# Patient Record
Sex: Male | Born: 1950 | Race: White | Hispanic: No | Marital: Married | Smoking: Never smoker
Health system: Southern US, Community
[De-identification: ages and names within clinical notes are randomized; demographics above are authoritative.]

## PROBLEM LIST (undated history)

## (undated) DIAGNOSIS — E7800 Pure hypercholesterolemia, unspecified: Secondary | ICD-10-CM

## (undated) DIAGNOSIS — I10 Essential (primary) hypertension: Secondary | ICD-10-CM

## (undated) DIAGNOSIS — G4730 Sleep apnea, unspecified: Secondary | ICD-10-CM

## (undated) DIAGNOSIS — C8300 Small cell B-cell lymphoma, unspecified site: Secondary | ICD-10-CM

## (undated) DIAGNOSIS — M779 Enthesopathy, unspecified: Secondary | ICD-10-CM

## (undated) DIAGNOSIS — E119 Type 2 diabetes mellitus without complications: Secondary | ICD-10-CM

## (undated) HISTORY — DX: Sleep apnea, unspecified: G47.30

## (undated) HISTORY — DX: Enthesopathy, unspecified: M77.9

## (undated) HISTORY — DX: Pure hypercholesterolemia, unspecified: E78.00

## (undated) HISTORY — DX: Essential (primary) hypertension: I10

## (undated) HISTORY — DX: Small cell B-cell lymphoma, unspecified site: C83.00

## (undated) HISTORY — DX: Type 2 diabetes mellitus without complications: E11.9

---
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
## 1999-02-14 ENCOUNTER — Ambulatory Visit (HOSPITAL_COMMUNITY): Admission: RE | Admit: 1999-02-14 | Discharge: 1999-02-14 | Payer: Self-pay | Admitting: Gastroenterology

## 1999-03-07 ENCOUNTER — Emergency Department (HOSPITAL_COMMUNITY): Admission: EM | Admit: 1999-03-07 | Discharge: 1999-03-07 | Payer: Self-pay | Admitting: Emergency Medicine

## 2001-09-20 ENCOUNTER — Encounter: Admission: RE | Admit: 2001-09-20 | Discharge: 2001-09-20 | Payer: Self-pay | Admitting: Orthopedic Surgery

## 2001-09-20 ENCOUNTER — Encounter: Payer: Self-pay | Admitting: Orthopedic Surgery

## 2003-01-23 ENCOUNTER — Ambulatory Visit (HOSPITAL_COMMUNITY): Admission: RE | Admit: 2003-01-23 | Discharge: 2003-01-23 | Payer: Self-pay | Admitting: Gastroenterology

## 2003-01-23 ENCOUNTER — Encounter (INDEPENDENT_AMBULATORY_CARE_PROVIDER_SITE_OTHER): Payer: Self-pay | Admitting: Specialist

## 2004-01-02 ENCOUNTER — Encounter: Admission: RE | Admit: 2004-01-02 | Discharge: 2004-04-01 | Payer: Self-pay | Admitting: Family Medicine

## 2007-03-25 ENCOUNTER — Encounter: Admission: RE | Admit: 2007-03-25 | Discharge: 2007-03-25 | Payer: Self-pay | Admitting: Family Medicine

## 2007-12-19 ENCOUNTER — Ambulatory Visit: Payer: Self-pay | Admitting: Cardiology

## 2007-12-19 ENCOUNTER — Observation Stay (HOSPITAL_COMMUNITY): Admission: EM | Admit: 2007-12-19 | Discharge: 2007-12-20 | Payer: Self-pay | Admitting: Emergency Medicine

## 2007-12-20 ENCOUNTER — Encounter (INDEPENDENT_AMBULATORY_CARE_PROVIDER_SITE_OTHER): Payer: Self-pay | Admitting: Cardiology

## 2008-05-05 IMAGING — CT CT ANGIO CHEST
2 of 4 series · 19 of 36 positions shown · IV contrast (100 ML OMNI 300)
Comparison: Chest radiograph 12/19/2007.

CLINICAL DATA: 56 year-old male who has left-sided chest pain and left shoulder, neck pain with shortness of breath.
 CT ANGIOGRAPHY OF CHEST:
TECHNIQUE: Multidetector CT imaging of the chest was performed during bolus injection of intravenous contrast.  Multiplanar CT angiographic image reconstructions were generated to evaluate the vascular anatomy.
 Contrast:  115ml Omnipaque 300.

[Series 2: pe · axial · 0.82mm/px · z∈[-332,-55]mm · 16 of 249 slices shown]
[im 14/249  lung]
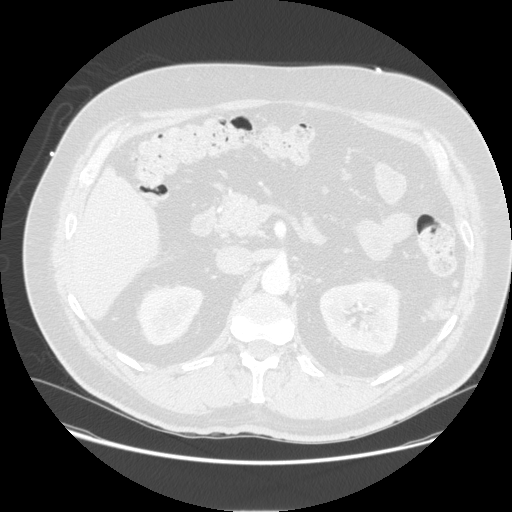
[im 28/249  mediastinal]
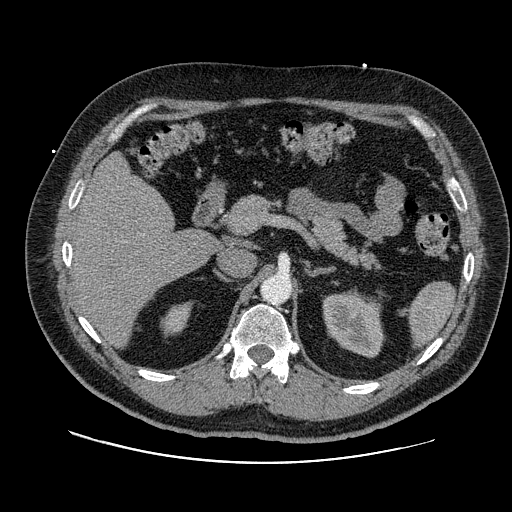
[im 42/249  lung]
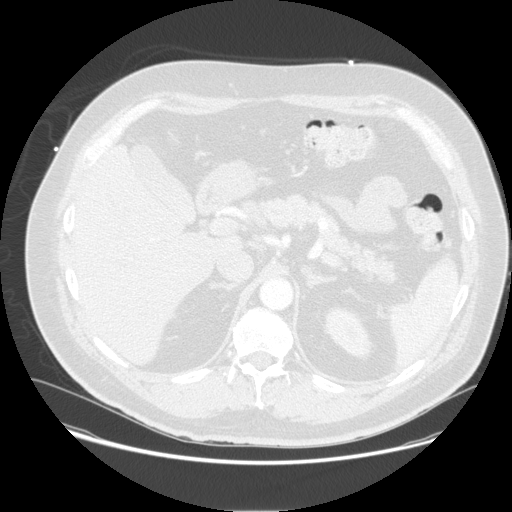
[im 56/249  mediastinal]
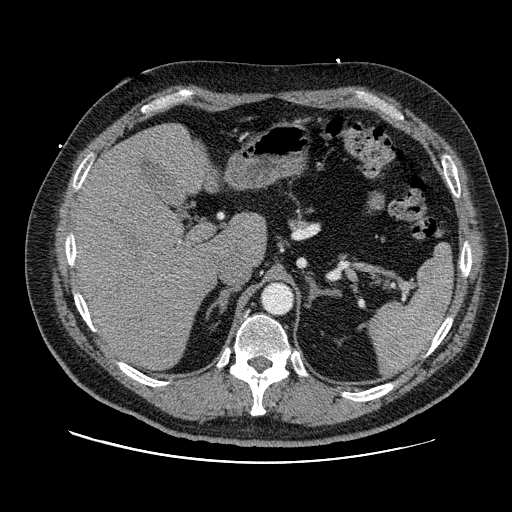
[im 69/249  lung]
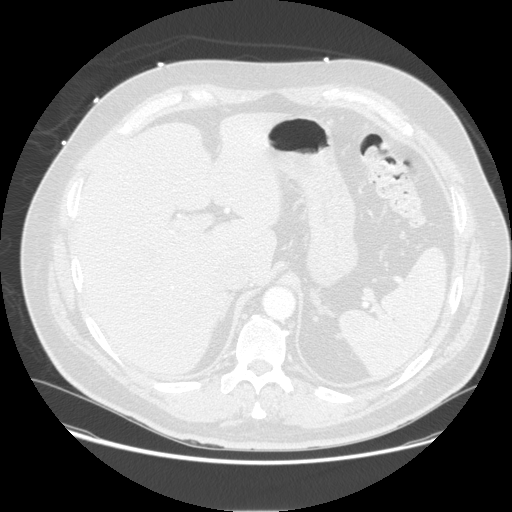
[im 83/249  mediastinal]
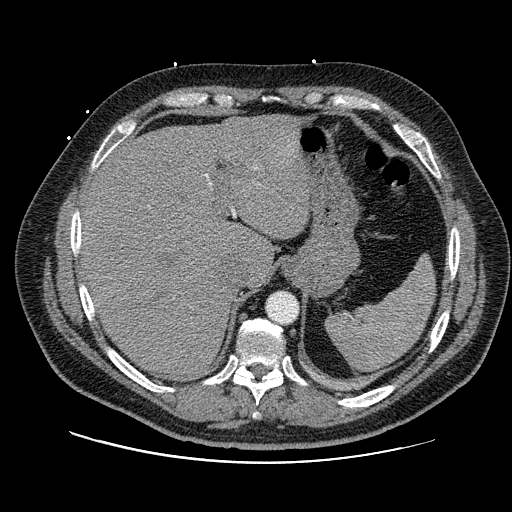
[im 97/249  lung]
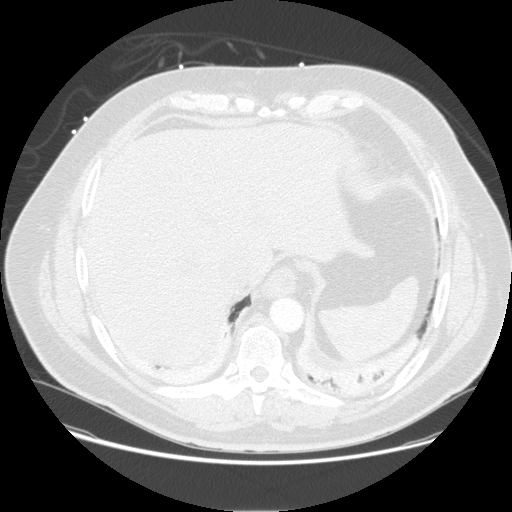
[im 111/249  mediastinal]
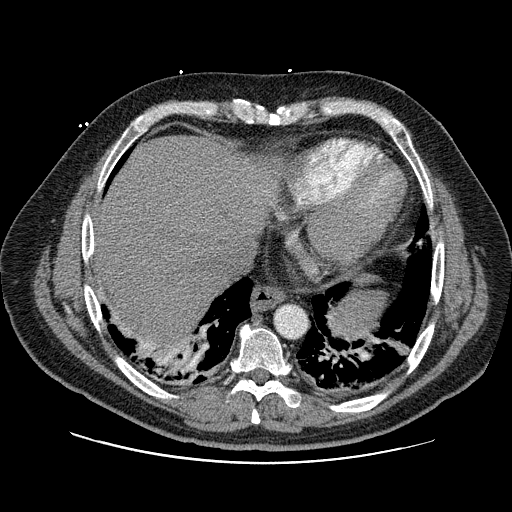
[im 138/249  lung]
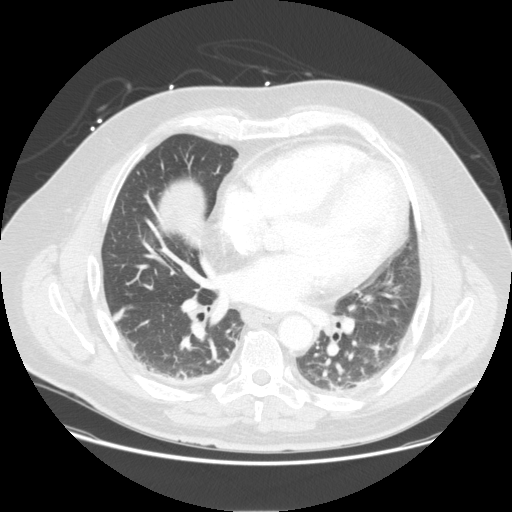
[im 152/249  mediastinal]
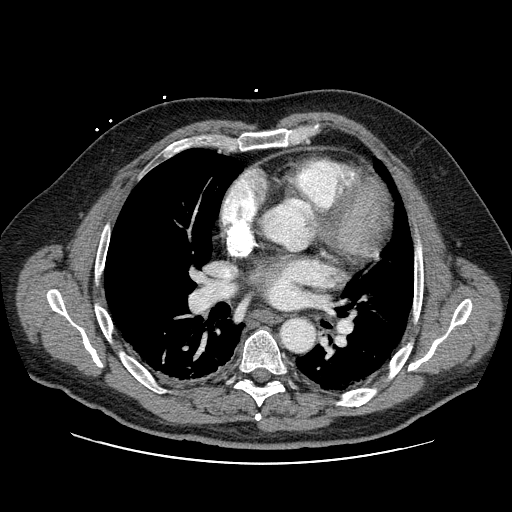
[im 166/249  lung]
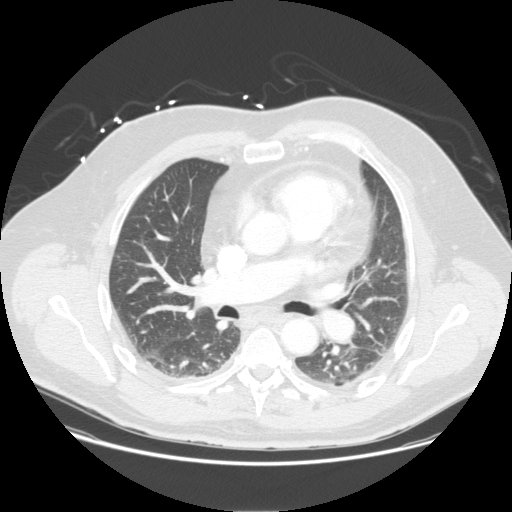
[im 180/249  mediastinal]
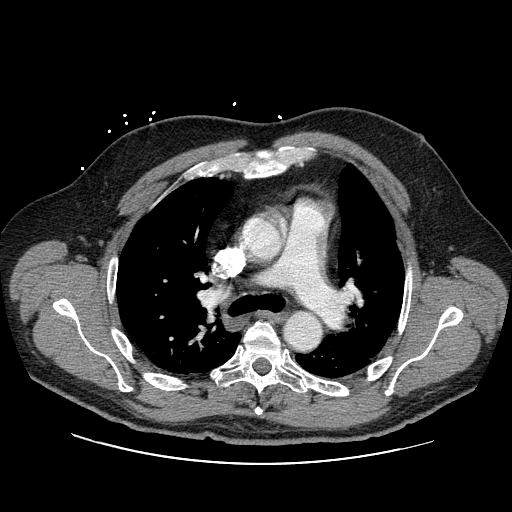
[im 193/249  lung]
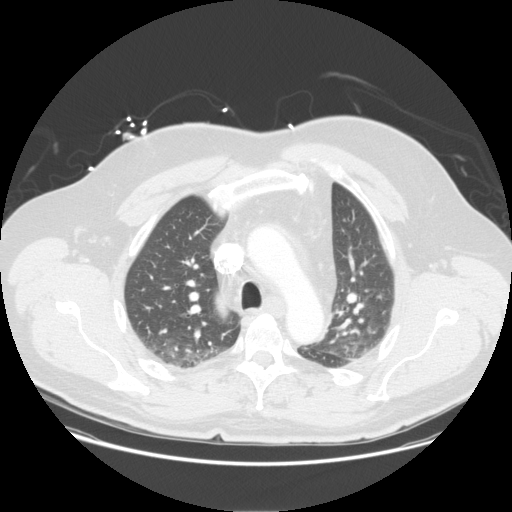
[im 207/249  mediastinal]
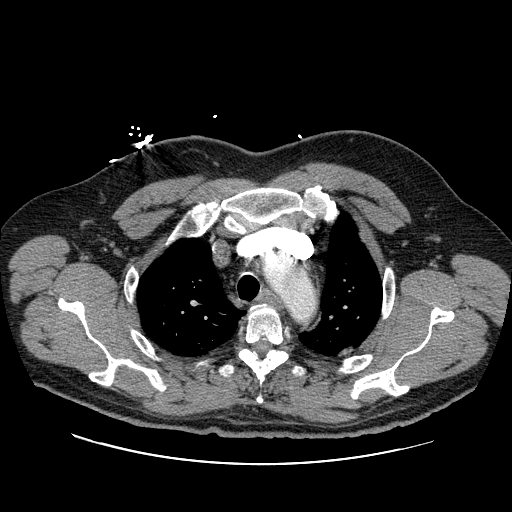
[im 221/249  lung]
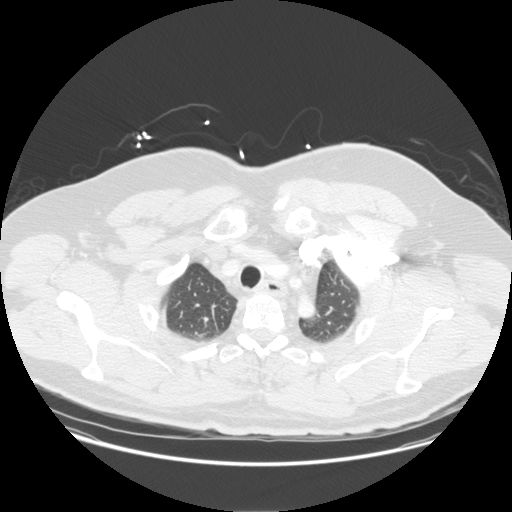
[im 235/249  mediastinal]
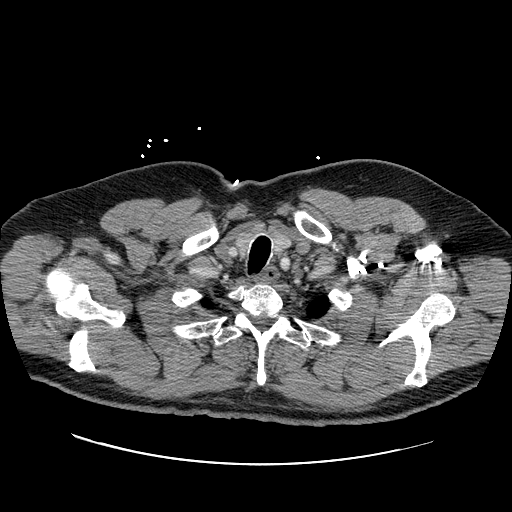

[Series 202: reformatted · coronal · 0.82mm/px · 3 of 100 slices shown]
[im 20/100  mediastinal]
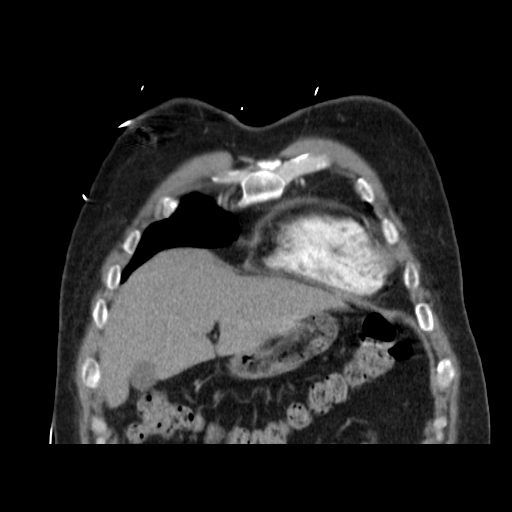
[im 40/100  mediastinal]
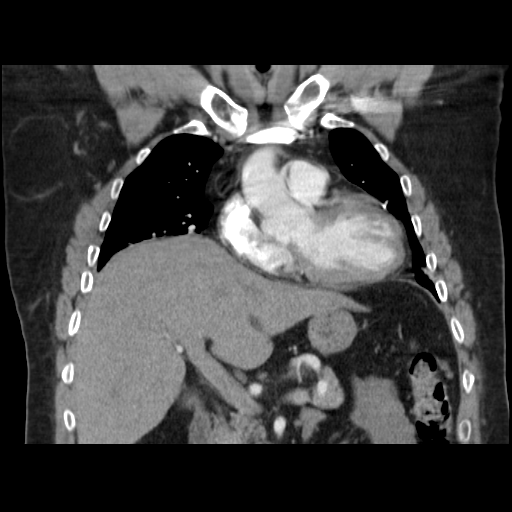
[im 60/100  mediastinal]
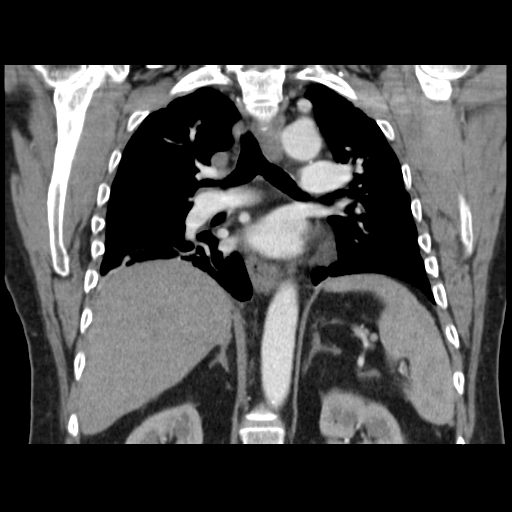

[19 of 36 positions shown; findings below may reference images not displayed]

FINDINGS: Adequate contrast timing in the pulmonary arterial system.  No focal filling defect identified to suggest the presence of pulmonary embolism.
 No pericardial effusion.  Trace right pleural effusion.  No mediastinal lymphadenopathy.  Prominence of the thoracic aorta may indicate mural thickening.  No discrete mass identified.  No axillary lymphadenopathy.  Visualized upper abdominal viscera within normal limits.
 Enhancing atelectatic appearing lung at both lung bases, right greater than left.  No definite consolidation, but there are scant air bronchograms on the right.  Major airways are patent.  Tiny nodular density along the right major fissure.  Lungs are otherwise clear.  Scattered small sclerotic foci in the ribs are thought to represent benign bone islands.  No suspicious osseous lesion.  Multilevel degenerative changes in the thoracic spine.
IMPRESSION: 1. No evidence of pulmonary embolism.
 2. Right greater than left pulmonary probable atelectasis.  I cannot exclude superimposed infection on the right.
 3. Suggestion of diffuse esophageal wall thickening.  Consider esophagitis.  Correlation with endoscopy may be helpful.

## 2011-02-24 NOTE — H&P (Signed)
[name], [name]             ACCOUNT NO.:  [id number]   MEDICAL RECORD NO.:  [id number]          PATIENT TYPE:  INP   LOCATION:  6522                         FACILITY:  [facility]   PHYSICIAN:  Rollene Rotunda, MD, FACCDATE OF BIRTH:  1951/05/06   DATE OF ADMISSION:  12/19/2007  DATE OF DISCHARGE:                              HISTORY & PHYSICAL   PRIMARY CARE PHYSICIAN:  Dr. Abigail Miyamoto   CARDIOLOGIST:  New.   REASON PRESENTATION:  The patient with shoulder discomfort and an  abnormal electrocardiogram.   HISTORY OF PRESENT ILLNESS:  The patient is a 60 year old gentleman  without prior cardiac history.  He developed some acute left shoulder  and neck discomfort at about 5 p.m.  This was at rest.  It was moderate  in intensity.  He was pacing the floor per his wife's description.  He  took 2 aspirin and fell asleep.  At 7, he woke without discomfort;  however, at 9 p.m. the pain returned.  He did take some sublingual  nitroglycerin and had some improvement in his discomfort and almost  complete resolution by the time he presented to the emergency room.  However, in the emergency room he was noted to have inferior ST segment  elevation in leads II, III, and aVF consistent with an acute injury  pattern.  The patient has never had this kind of discomfort before.  There was no substernal chest discomfort.  There was no associated  nausea, vomiting or diaphoresis.  He was not particularly short of  breath.  He is not a particularly active gentleman but does not bring on  the symptoms typically with his usual activities.   PAST MEDICAL HISTORY:  1. Hypertension times years.  2. Diabetes mellitus times 2-3 years.  3. Dyslipidemia.   PAST SURGICAL HISTORY:  Tonsillectomy.   ALLERGIES:  HE IS INTOLERANT OF ACCUPRIL.   CURRENT MEDICATIONS:  1. Norvasc 10 mg daily.  2. Hydrochlorothiazide 25 mg daily.  3. Doxazosin 2 mg daily.  4. Actos 30 mg daily.  5. Lipitor 20 mg daily.   REVIEW OF SYSTEMS:  As stated in the HPI, otherwise negative for other  systems.   PHYSICAL EXAMINATION:  The patient is in no distress.  Blood pressure  122/70.  Rate 81 and regular.  Afebrile.  HEENT:  Eyes unremarkable, pupils equal, round and reactive to light.  Fundi not visualized.  Oral mucosa unremarkable.  NECK:  No jugular venous distention, 45 degrees, carotid upstroke  briskand symmetric.  No bruits, thyromegaly  LYMPHATICS:  No cervical, axillary, inguinal adenopathy.  LUNGS:  Clear to auscultation bilaterally.  BACK:  No costovertebral angle tenderness.  CHEST:  Unremarkable.  HEART:  PMI not displaced, sustained.  S1 and S2, no S3, S4, no clicks,  rubs, murmurs.  ABDOMEN:  Obese, positive bowel sounds.  Normal frequency, pitch.  No  bruits, rebound no guarding no midline pulsatile masshepatomegaly.  SKIN:  No rashes, upper extremities 2+ pulses No edema, no cyanosis, no  clubbing .  NEUROLOGIC:  Oriented to person, place and time.  Cranial nerves II  through XII grossly intact.  Motor grossly intact.   EKG sinus rhythm, rate 77, axis within normal limits.  Intervals within  normal limits.  Inferior ST elevation 2, 3, and aVF with depression in  lead V1.   LABORATORIES:  Pending.   Chest x-ray:  Official report pending, although there appears to be some  mild atelectasis and a poor inspiratory effort.   ASSESSMENT AND PLAN:  1. Chest.  The patient appears to be having an acute inferior      myocardial infarction with EKG changes.  2. Shoulder discomfort.  The ST segments improved with resolution of      his shoulder discomfort.  Because of this, he will be taken      urgently to the cath lab.  He will be given Heparin, aspirin,      Plavix.  Dr. Excell Seltzer will perform the catheterization.  3. Diabetes.  He will continue Actos and sliding scale insulin as      needed.  4. Risk reduction.  We will check a lipid profile.  He is not sure the      dose of Lipitor he is  on, but I will start 20 mg and check a lipid      profile.  5. Obesity.  He will be counseled about the need to lose weight with      diet and exercise.      Rollene Rotunda, MD, [facility]  Electronically Signed     JH/MEDQ  D:  12/19/2007  T:  12/20/2007  Job:  [id number]   cc:   Chales Salmon. Abigail Miyamoto, M.D.

## 2011-02-24 NOTE — Discharge Summary (Signed)
Cory [name], Cory [name]             ACCOUNT NO.:  [id number]   MEDICAL RECORD NO.:  [id number]          PATIENT TYPE:  INP   LOCATION:  6522                         FACILITY:  [facility]   PHYSICIAN:  Georga Hacking, M.D.DATE OF BIRTH:  28-Apr-1951   DATE OF ADMISSION:  12/19/2007  DATE OF DISCHARGE:  12/20/2007                               DISCHARGE SUMMARY   FINAL DIAGNOSES:  1. Chest, shoulder, and neck pain of uncertain etiology with an      abnormal EKG suggesting an inferior infarction.      a.     Catheterization showing no significant obstructive coronary       artery disease.  There was minimal nonobstructive coronary artery       disease noted.  There was normal left ventricular function.       Normal aortic root.      b.     Negative enzymes for myocardial infarction.      c.     CT angiogram showing no evidence of pulmonary emboli or       aortic dissection.      d.     Echocardiogram showing no evidence for pericardial effusion.  2. Hypertension.  3. Type 2 diabetes.  4. Dyslipidemia.  5. Obesity.   HISTORY:  This 60 year old male developed a left shoulder and neck  discomfort around 5 p.m. at rest, described as 5/10.  He took 2 aspirin  and fell asleep and woke at 7 without pain; however, the pain returned,  and he took nitroglycerin that he has used previously and the pain  improved.  He came to the emergency room and had an EKG with 3 mm of ST  elevation in inferolateral leads and ST depression in V1.  Because of  the suggestive nature of the pain, he was taken emergently to the  catheterization laboratory.  Please see the previously dictated history  and physical for remainder of the details.   HOSPITAL COURSE:  Laboratory data on admission showed a white count of  14,500, 73% neutrophils, 18% lymphocytes.  PT and PTT were normal.  Sodium is 139, potassium 3.6, chloride 104, glucose is 172, BUN is 15,  and creatinine is 1.2.  Initial point-of-care enzymes were  normal.  Cardiac enzymes the next morning were completely normal.  Cholesterol  was 121 with triglycerides of 65, HDL of 39, LDL of 69.  White count was  11,700 the next morning with normal hemoglobin.   The patient was taken emergently to the cardiac catheterization  laboratory.  He was not found to have any significant obstructive  coronary disease.  He had normal left ventricular function.  He was  admitted by the cardiology night coverage and I saw him the next  morning.  He had a temperature of 100.3 the next morning, but beyond  that, his exam was unremarkable.  An echocardiogram showed no evidence  of pericardial effusion.  Left ventricular function was normal with  slight disproportionate septal thickening.  He had valves that looked  fine.  A CT angiogram of the chest showed atelectasis in the bases.  No  evidence for pulmonary emboli or acute aortic dissection.  A copy of an  EKG was obtained from Dr. Recardo Evangelist office showing that these were new  changes from a previous EKG.   At this point in time, since he is ruled out from myocardial infarction,  he is discharged at this time in improved condition.  With new EKG  changes, this is possible that he has some early pericarditis, although  he did not have a rub or possibly an infectious etiology causing his  symptoms and possible abnormal EKG such as myocarditis or pericarditis.  At this point in time, we are going to let him go home, and he will be  discharged on aspirin 325 mg twice a day to take for a couple of weeks.  He is to continue his home medications.   DISCHARGE MEDICATIONS:  Include:  1. Amlodipine 10 mg daily.  2. Hydrochlorothiazide 25 mg daily.  3. Doxazosin 2 mg daily.  4. Actos 30 mg daily.  5. Viagra as needed.  6. Aspirin 325 mg twice a day.  7. He has nitroglycerin to use at home.   It is recommended that he follow up with Dr. Abigail Miyamoto for a repeat EKG in  approximately 2 weeks.  He does not need to  have additional cardiology  followup unless he has recurrent symptoms or pain.  He is to monitor his  temperature at home.      Georga Hacking, M.D.  Electronically Signed     WST/MEDQ  D:  12/20/2007  T:  12/21/2007  Job:  [id number]   cc:   Veverly Fells. Excell Seltzer, MD  Chales Salmon. Abigail Miyamoto, M.D.

## 2011-02-24 NOTE — Cardiovascular Report (Signed)
NAME:  Cory [name], Cory [name]             ACCOUNT NO.:  [id number]   MEDICAL RECORD NO.:  [id number]          PATIENT TYPE:  INP   LOCATION:  1823                         FACILITY:  [facility]   PHYSICIAN:  Veverly Fells. Excell Seltzer, MD  DATE OF BIRTH:  1951-03-26   DATE OF PROCEDURE:  12/19/2007  DATE OF DISCHARGE:                            CARDIAC CATHETERIZATION   PROCEDURE:  Left heart catheterization, selective coronary angiography,  left ventricular angiography, aortic root angiography, Angio-Seal of the  right femoral artery.   INDICATIONS:  Cory [name] is a 60 year old gentleman with dyslipidemia,  hypertension and diabetes who presented with chest pain.  His EKG was  suggestive of an inferolateral myocardial infarction.  He was brought  emergently for cardiac catheterization.   Risks and indications of the procedure were reviewed in detail with the  patient and his wife.  Informed consent was obtained.  The right groin  was prepped, draped and anesthetized with 1% lidocaine.  Using modified  Seldinger technique, a 6-French sheath was placed in the right femoral  artery.  Standard 6-French Judkins catheters were used for selective  coronary angiography.  An angled pigtail catheter was used for left  ventriculography and aortic root angiography, a pullback across the  aortic valve was performed.  The patient tolerated procedure well.  All  catheter exchanges were performed over a guidewire.  There were no  immediate complications.  At the completion of the procedure, an Angio-  Seal device was used to close the femoral arteriotomy.   FINDINGS:  Aortic pressure 121/71 with a mean of 93, left ventricular  pressure 121/13.   Coronary angiography.  The left mainstem is angiographically normal.  It  trifurcates into the LAD, intermediate branch and left circumflex.   The LAD is a large-caliber vessel that courses down and wraps around the  LV apex.  There is a large first diagonal branch that  has minimal plaque  in its proximal portion.  Otherwise, there is no significant  angiographic stenosis.  The second diagonal also has minimal plaque in  its ostium, creating a 40% stenosis.  The junction at the mid to distal  LAD has minor nonobstructive plaque and is somewhat phasic suggestive of  an intramyocardial segment.  There are no significant stenoses  throughout the course of the LAD.   The intermediate branch is large-caliber.  There is minor ostial  stenosis but no significant obstructive disease throughout.   The left circumflex is of large caliber.  It is a dominant vessel.  It  courses down and supplies a posterolateral branch as well as a left PDA  and a large atrial branch.  There are no significant stenoses throughout  the left circumflex.   The right coronary artery is nondominant.  It supplies a moderate-sized  RV marginal branch from the proximal portion of the vessel.  There is no  significant stenosis in the right coronary artery.   Left ventriculography shows normal LV function.  The LVEF is estimated  at 60-65%.  There is no mitral regurgitation.   Aortic root angiography shows normal size proximal aorta without  aneurysm.  ASSESSMENT:  1. Minimal nonobstructive coronary artery disease.  2. Normal left ventricular function.  3. Normal aortic root.   PLAN:  Will observe Cory [name] overnight.  We will check a D-dimer with  his a.m. labs.  We will cycle cardiac enzymes, but this initially  appears to be a noncardiac event.      Veverly Fells. Excell Seltzer, MD  Electronically Signed     MDC/MEDQ  D:  12/20/2007  T:  12/20/2007  Job:  [id number]   cc:   Chales Salmon. Abigail Miyamoto, M.D.

## 2011-02-27 NOTE — Op Note (Signed)
   NAME:  Cory [name], Cory [name]                         ACCOUNT NO.:  [id number]   MEDICAL RECORD NO.:  [id number]                   PATIENT TYPE:  AMB   LOCATION:  ENDO                                 FACILITY:  [facility]   PHYSICIAN:  John C. Madilyn Fireman, M.D.                 DATE OF BIRTH:  [birth year]   DATE OF PROCEDURE:  01/23/2003  DATE OF DISCHARGE:                                 OPERATIVE REPORT   INDICATIONS FOR PROCEDURE:  Colon cancer screening.   PROCEDURE:  The patient was placed in the left lateral decubitus position  and placed on the pulse monitor with continuous low flow oxygen delivered by  nasal cannula.  He was sedated with 75 mcg IV Fentanyl and 6 mg IV Versed.  The Olympus video colonoscope was inserted into the rectum and advanced to  the cecum, confirmed by transillumination of McBurney's point and  visualization of the ileocecal valve and appendiceal orifice.  The prep was  excellent.  Within the base of the cecum there was a 6.0 mm polyp which was  fulgurated by hot biopsy.  The remainder of the cecum and ascending colon  appeared normal.  Within the transverse colon there was an 8.0 mm sessile  polyp which was removed by snare and sent in the same specimen container.  Within the descending colon there was seen a third polyp approximately 8.0 -  10 mm in diameter and this was removed by snare.  No other abnormalities  were seen in the descending and sigmoid colon.  In the rectum at  approximately 12 cm from the anus there was an 8.0 mm polyp also removed by  snare and sent in container #2.  The scope was then withdrawn and the  patient returned to the recovery room in stable condition.  He tolerated the  procedure well and there were no immediate complications.   IMPRESSION:  1. Four colon polyps.   PLAN:  Await histology to determine method and intervals for future colon  screening.                                               John C. Madilyn Fireman, M.D.    JCH/MEDQ  D:   01/23/2003  T:  01/23/2003  Job:  [id number]   cc:   Chales Salmon. Abigail Miyamoto, M.D.  [address]  [place]  [place] [address]  Fax: [phone]

## 2011-04-17 ENCOUNTER — Other Ambulatory Visit: Payer: Self-pay | Admitting: Gastroenterology

## 2011-07-06 LAB — I-STAT 8, (EC8 V) (CONVERTED LAB)
Acid-Base Excess: 2
BUN: 15
Bicarbonate: 26.9 — ABNORMAL HIGH
Chloride: 104
pCO2, Ven: 44 — ABNORMAL LOW
pH, Ven: 7.395 — ABNORMAL HIGH

## 2011-07-06 LAB — LIPID PANEL
Cholesterol: 121
LDL Cholesterol: 69

## 2011-07-06 LAB — DIFFERENTIAL
Eosinophils Absolute: 0
Lymphs Abs: 2.6
Monocytes Absolute: 1.2 — ABNORMAL HIGH
Monocytes Relative: 8
Neutrophils Relative %: 73

## 2011-07-06 LAB — CBC
HCT: 42.6
Hemoglobin: 14.7
MCHC: 34.5
MCV: 84.8
Platelets: 258
RBC: 5.02
WBC: 11.1 — ABNORMAL HIGH
WBC: 14.5 — ABNORMAL HIGH

## 2011-07-06 LAB — CARDIAC PANEL(CRET KIN+CKTOT+MB+TROPI)
CK, MB: 0.6
CK, MB: 0.7
Relative Index: INVALID
Total CK: 56
Troponin I: <0.01

## 2011-07-06 LAB — POCT CARDIAC MARKERS
CKMB, poc: <1 — ABNORMAL LOW
Myoglobin, poc: 66
Troponin i, poc: <0.05

## 2011-07-06 LAB — SEDIMENTATION RATE: Sed Rate: 12

## 2011-07-06 LAB — D-DIMER, QUANTITATIVE: D-Dimer, Quant: <0.22

## 2011-07-06 LAB — POCT I-STAT CREATININE: Creatinine, Ser: 1.2

## 2011-07-06 LAB — PROTIME-INR: INR: 0.9

## 2012-06-03 ENCOUNTER — Telehealth: Payer: Self-pay | Admitting: Oncology

## 2012-06-03 NOTE — Telephone Encounter (Signed)
C/D on 8/23 for NP appt on 9/10

## 2012-06-03 NOTE — Telephone Encounter (Signed)
S/W pt re NP appt on 9/10 @ 10:30 W/ Dr. Clelia Croft.  Referring Dr. Tenny Craw [facility]  NP packet mailed out.

## 2012-06-15 ENCOUNTER — Other Ambulatory Visit: Payer: Self-pay | Admitting: Dermatology

## 2012-06-17 ENCOUNTER — Other Ambulatory Visit: Payer: Self-pay | Admitting: Oncology

## 2012-06-17 DIAGNOSIS — D72829 Elevated white blood cell count, unspecified: Secondary | ICD-10-CM | POA: Insufficient documentation

## 2012-06-21 ENCOUNTER — Other Ambulatory Visit (HOSPITAL_COMMUNITY)
Admission: RE | Admit: 2012-06-21 | Discharge: 2012-06-21 | Disposition: A | Discharge status: Home / Self Care | Payer: 59 | Source: Ambulatory Visit | Attending: Oncology | Admitting: Oncology

## 2012-06-21 ENCOUNTER — Ambulatory Visit (HOSPITAL_BASED_OUTPATIENT_CLINIC_OR_DEPARTMENT_OTHER): Payer: 59 | Admitting: Oncology

## 2012-06-21 ENCOUNTER — Other Ambulatory Visit (HOSPITAL_BASED_OUTPATIENT_CLINIC_OR_DEPARTMENT_OTHER): Payer: 59 | Admitting: Lab

## 2012-06-21 ENCOUNTER — Telehealth: Payer: Self-pay | Admitting: Oncology

## 2012-06-21 ENCOUNTER — Ambulatory Visit: Payer: Self-pay

## 2012-06-21 VITALS — BP 142/76 | HR 60 | Temp 98.9°F | Resp 22 | Ht 71.5 in | Wt 261.9 lb

## 2012-06-21 DIAGNOSIS — I10 Essential (primary) hypertension: Secondary | ICD-10-CM

## 2012-06-21 DIAGNOSIS — G4733 Obstructive sleep apnea (adult) (pediatric): Secondary | ICD-10-CM | POA: Insufficient documentation

## 2012-06-21 DIAGNOSIS — D72829 Elevated white blood cell count, unspecified: Secondary | ICD-10-CM

## 2012-06-21 DIAGNOSIS — D72820 Lymphocytosis (symptomatic): Secondary | ICD-10-CM | POA: Insufficient documentation

## 2012-06-21 DIAGNOSIS — R5381 Other malaise: Secondary | ICD-10-CM

## 2012-06-21 LAB — CBC WITH DIFFERENTIAL/PLATELET
Eosinophils Absolute: 0.1 10*3/uL (ref 0.0–0.5)
MONO#: 1 10*3/uL — ABNORMAL HIGH (ref 0.1–0.9)
MONO%: 7.3 % (ref 0.0–14.0)
NEUT#: 5.3 10*3/uL (ref 1.5–6.5)
RBC: 5.05 10*6/uL (ref 4.20–5.82)
RDW: 13.2 % (ref 11.0–14.6)
WBC: 13.6 10*3/uL — ABNORMAL HIGH (ref 4.0–10.3)
lymph#: 7.1 10*3/uL — ABNORMAL HIGH (ref 0.9–3.3)

## 2012-06-21 LAB — COMPREHENSIVE METABOLIC PANEL (CC13)
Albumin: 4.1 g/dL (ref 3.5–5.0)
Alkaline Phosphatase: 54 U/L (ref 40–150)
Glucose: 82 mg/dl (ref 70–99)
Potassium: 4.1 mEq/L (ref 3.5–5.1)
Sodium: 141 mEq/L (ref 136–145)
Total Protein: 6.7 g/dL (ref 6.4–8.3)

## 2012-06-21 LAB — TECHNOLOGIST REVIEW

## 2012-06-21 NOTE — Telephone Encounter (Signed)
appts made and printed for pt  °

## 2012-06-21 NOTE — Progress Notes (Signed)
Note dictated

## 2012-06-21 NOTE — Progress Notes (Signed)
CC:   Cory [name]) Cory [name], M.D.  REASON FOR CONSULTATION:  Leukocytosis.  HISTORY OF PRESENT ILLNESS:  [name] is a pleasant 61 year old gentleman, native of the [place] part of [place], currently of [place] for the last 20 years.  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  He is a gentleman with a past medical history of obesity, hypertension, and diabetes followed by Dr. Tenny [name] at [facility].  He has, as mentioned, complaints of fatigue and tiredness and was diagnosis with low testosterone, and was treated with testosterone supplements without really any benefit.  But, he was noted recently to have leukocytosis based on a recent CBC om May 30, 2012.  His white cell count was 14.3.  Previously in February 2013, it was 12.6, before that was 7.6 in June 2011.  The rest of his CBC was normal with a normal hemoglobin, normal hematocrit, normal platelet count, normal kidney function and liver function tests.  The patient was referred to me for evaluation for that issue.  Clinically, as mentioned, he does report some fatigue but is still able to perform most of his work-related duties without any hindrance or decline.  He does report some tiredness on the weekend but able to drive.  He had not reported any fevers, did not report any chills, had not reported any weight loss, appetite changes.  He did not report any constitutional symptoms.  He did not have any viral illnesses.  Did not report any arthralgias and myalgias.  The rest of his review of systems, he did not report any headaches, blurred vision, double vision.  He did not report any motor or sensory neuropathy.  He did not report any alteration in mental status, psychiatric issues, depression.  He did not report any fever, chills, sweats.  He did not report any cough, hemoptysis, hematemesis.  No nausea or vomiting.  No abdominal pain, hematochezia, melena, genitourinary  complaints.  Rest of review of systems is unremarkable.  PAST MEDICAL HISTORY:  Obesity, hypertension, diabetes, hyperlipidemia, history of skin cancer, sleep apnea.  MEDICATION:  Aspirin, multivitamin, Viagra as needed, losartan, metformin, amlodipine, atorvastatin, glyburide.  ALLERGIES:  Accupril.  SOCIAL HISTORY:  He is married.  He has 1 daughter.  Denied any alcohol or tobacco abuse.  Works in Airline pilot.  FAMILY HISTORY:  He has 1 sister who had breast cancer.  Father died in his 24s of coronary artery disease.  Mother is 31 and in good health.  PHYSICAL EXAMINATION:  General:  Alert, awake gentleman, appeared in no active distress today.  Vital Signs:  His blood pressure is 142/76, pulse is 60, respirations 22, temperature is 98.  ECOG performance status is 0.  HEENT:  Head is normocephalic, atraumatic.  Pupils equal, round, reactive to light.  Oral mucosa moist and pink.  Neck:  Supple without adenopathy.  Heart:  Regular rate and rhythm.  S1, S2.  Lungs: Clear to auscultation.  Abdomen:  Soft, nontender.  No hepatosplenomegaly.  Extremities:  No clubbing, cyanosis, or edema. Lymphatic:  I could not appreciate any lymphadenopathy.  LABORATORY DATA:  White cell count of 13.6, hemoglobin of 14, platelet count of 289.  His lymphocyte percentage is up at 52%, slightly low neutrophil percentage at 38.8.  Peripheral smear was personally reviewed today.  Did not really show any evidence of myelodysplasia.  Cannot appreciate any blasts.  There is no evidence of any schistocytosis or red cell fragmentation.  There are  slightly increased lymphocytes that did not appear abnormal at this time.  ASSESSMENT AND PLAN:  This is a 61 year old gentleman with a mild leukocytosis and mild lymphocytosis.  The differential diagnosis discussed with Cory [name] which includes a viral infection versus reactive lymphocytosis, versus medication related, versus a lymphoproliferative disorder.  The  most common condition for his age group would be chronic lymphocytic leukemia or chronic lymphocytic leukemia (CLL).  I am evaluating that as well.  I am obtaining a peripheral blood for flow cytometry.  I discussed the natural course of CLL as potentially the worse case scenario in him.  The potential complications associated with CLL, short-term and long-term, were discussed.  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  I do not think his fatigue or tiredness related to this.  Probably related mostly to his obesity, sleep apnea, and diabetes more than anything else.  All of his questions were answered today and I will have him follow up in 3 month's time. Recheck his blood and have another conversation.  If it is indeed CLL, I think watch and wait would be the way to manage this at this point as no treatment is warranted at this time.    ______________________________ Benjiman Core, M.D. FNS/MEDQ  D:  06/21/2012  T:  06/21/2012  Job:  [id number]

## 2012-09-20 ENCOUNTER — Ambulatory Visit (HOSPITAL_BASED_OUTPATIENT_CLINIC_OR_DEPARTMENT_OTHER): Payer: 59 | Admitting: Oncology

## 2012-09-20 ENCOUNTER — Other Ambulatory Visit (HOSPITAL_BASED_OUTPATIENT_CLINIC_OR_DEPARTMENT_OTHER): Payer: 59 | Admitting: Lab

## 2012-09-20 ENCOUNTER — Telehealth: Payer: Self-pay | Admitting: Oncology

## 2012-09-20 VITALS — BP 142/78 | HR 69 | Temp 97.1°F | Resp 20 | Ht 71.5 in | Wt 262.8 lb

## 2012-09-20 DIAGNOSIS — D72820 Lymphocytosis (symptomatic): Secondary | ICD-10-CM

## 2012-09-20 DIAGNOSIS — D72829 Elevated white blood cell count, unspecified: Secondary | ICD-10-CM

## 2012-09-20 LAB — COMPREHENSIVE METABOLIC PANEL (CC13)
Albumin: 4.2 g/dL (ref 3.5–5.0)
BUN: 14 mg/dL (ref 7.0–26.0)
CO2: 29 mEq/L (ref 22–29)
Calcium: 9.9 mg/dL (ref 8.4–10.4)
Glucose: 120 mg/dl — ABNORMAL HIGH (ref 70–99)
Potassium: 3.9 mEq/L (ref 3.5–5.1)
Sodium: 141 mEq/L (ref 136–145)
Total Protein: 6.8 g/dL (ref 6.4–8.3)

## 2012-09-20 LAB — TECHNOLOGIST REVIEW

## 2012-09-20 LAB — CBC WITH DIFFERENTIAL/PLATELET
Basophils Absolute: 0.2 10*3/uL — ABNORMAL HIGH (ref 0.0–0.1)
Eosinophils Absolute: 0.1 10*3/uL (ref 0.0–0.5)
HGB: 14.8 g/dL (ref 13.0–17.1)
MONO#: 0.5 10*3/uL (ref 0.1–0.9)
NEUT#: 4.3 10*3/uL (ref 1.5–6.5)
Platelets: 319 10*3/uL (ref 140–400)
RBC: 5.12 10*6/uL (ref 4.20–5.82)
RDW: 13.3 % (ref 11.0–14.6)
WBC: 14.4 10*3/uL — ABNORMAL HIGH (ref 4.0–10.3)

## 2012-09-20 LAB — CHCC SMEAR

## 2012-09-20 NOTE — Progress Notes (Signed)
Hematology and Oncology Follow Up Visit  Cory [name] [id number] 1951-01-08 61 y.o. 09/20/2012 9:33 AM   Principle Diagnosis: 61 year old with lymphocytosis. 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 Diagnosed in 06/2102.   Current therapy: Follow up only  Interim History: 61 year old man I am seeing in follow up since his first visit in 06/2012. He has lymphocytosis and flow cytometry show possible CLL markers. He continued to be asymptomatic at this time He does report some tiredness on the weekend but able to drive. He had not reported any fevers, did not report any chills, had not reported any weight loss, appetite changes. He did not report any constitutional symptoms. He did not have any viral illnesses. Did not report any arthralgias and myalgias.   Medications: I have reviewed the patient's current medications. Current outpatient prescriptions:amLODipine (NORVASC) 10 MG tablet, Take 10 mg by mouth Daily., Disp: , Rfl: ;  aspirin 81 MG tablet, Take 81 mg by mouth daily., Disp: , Rfl: ;  atorvastatin (LIPITOR) 40 MG tablet, Take 40 mg by mouth Daily., Disp: , Rfl: ;  glimepiride (AMARYL) 2 MG tablet, Take 2 mg by mouth Daily., Disp: , Rfl: ;  losartan-hydrochlorothiazide (HYZAAR) 100-25 MG per tablet, Take by mouth Daily., Disp: , Rfl:  metFORMIN (GLUCOPHAGE-XR) 500 MG 24 hr tablet, Take 500 mg by mouth., Disp: , Rfl: ;  Multiple Vitamin (MULTIVITAMIN) capsule, Take 1 capsule by mouth daily., Disp: , Rfl: ;  Multiple Vitamins-Minerals (ICAPS MV PO), Take 1 capsule by mouth daily., Disp: , Rfl: ;  sildenafil (VIAGRA) 50 MG tablet, Take 50 mg by mouth daily as needed., Disp: , Rfl:   Allergies:  Allergies  Allergen Reactions  . Accupril (Quinapril Hcl)     Past Medical History, Surgical history, Social history, and Family History were reviewed and updated.  Review of Systems: Constitutional:  Negative for fever, chills, night sweats, anorexia, weight loss,  pain. Cardiovascular: no chest pain or dyspnea on exertion Respiratory: no cough, shortness of breath, or wheezing Neurological: negative Dermatological: negative ENT: negative Skin: Negative. Gastrointestinal: negative Genito-Urinary: negative Hematological and Lymphatic: negative Breast: negative Musculoskeletal: negative Remaining ROS negative. Physical Exam: Blood pressure 142/78, pulse 69, temperature 97.1 F (36.2 C), temperature source Oral, resp. rate 20, height 5' 11.5" (1.816 m), weight 262 lb 12.8 oz (119.205 kg). ECOG: 0 General appearance: alert Head: Normocephalic, without obvious abnormality, atraumatic Neck: no adenopathy, no carotid bruit, no JVD, supple, symmetrical, trachea midline and thyroid not enlarged, symmetric, no tenderness/mass/nodules Lymph nodes: Cervical, supraclavicular, and axillary nodes normal. Heart:regular rate and rhythm, S1, S2 normal, no murmur, click, rub or gallop Lung:chest clear, no wheezing, rales, normal symmetric air entry Abdomin: soft, non-tender, without masses or organomegaly EXT:no erythema, induration, or nodules   Lab Results: Lab Results  Component Value Date   WBC 14.4* 09/20/2012   HGB 14.8 09/20/2012   HCT 44.3 09/20/2012   MCV 86.6 09/20/2012   PLT 319 09/20/2012     Chemistry      Component Value Date/Time   NA 141 06/21/2012 1038   NA 138 12/19/2007 2253   K 4.1 06/21/2012 1038   K 3.6 12/19/2007 2253   CL 104 06/21/2012 1038   CL 104 12/19/2007 2253   CO2 28 06/21/2012 1038   BUN 14.0 06/21/2012 1038   BUN 15 12/19/2007 2253   CREATININE 0.9 06/21/2012 1038   CREATININE 1.2 12/19/2007 2253      Component Value Date/Time  CALCIUM 10.1 06/21/2012 1038   ALKPHOS 54 06/21/2012 1038   AST 18 06/21/2012 1038   ALT 21 06/21/2012 1038   BILITOT 0.70 06/21/2012 1038      Impression and Plan: This is a 61 year old gentleman with a mild leukocytosis and mild lymphocytosis. Flow cytometry did show B cell markers consistent  with CLL.  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  At this point he is asymptomatic.  I will continue active follow up and repeat exam and blood work in 6 months.     [facility], MD 12/10/20139:33 AM

## 2012-09-20 NOTE — Telephone Encounter (Signed)
gv and printed appt schedule for pt for June 2014 ° °

## 2012-10-12 HISTORY — PX: KNEE SURGERY: SHX244

## 2012-11-26 ENCOUNTER — Other Ambulatory Visit: Payer: Self-pay

## 2012-12-20 DIAGNOSIS — E1051 Type 1 diabetes mellitus with diabetic peripheral angiopathy without gangrene: Secondary | ICD-10-CM | POA: Insufficient documentation

## 2012-12-20 DIAGNOSIS — G4730 Sleep apnea, unspecified: Secondary | ICD-10-CM | POA: Insufficient documentation

## 2012-12-20 DIAGNOSIS — G20A1 Parkinson's disease without dyskinesia, without mention of fluctuations: Secondary | ICD-10-CM | POA: Insufficient documentation

## 2012-12-20 DIAGNOSIS — E785 Hyperlipidemia, unspecified: Secondary | ICD-10-CM | POA: Insufficient documentation

## 2012-12-20 DIAGNOSIS — G20 Parkinson's disease: Secondary | ICD-10-CM | POA: Insufficient documentation

## 2012-12-20 DIAGNOSIS — E1059 Type 1 diabetes mellitus with other circulatory complications: Secondary | ICD-10-CM | POA: Insufficient documentation

## 2012-12-20 DIAGNOSIS — I10 Essential (primary) hypertension: Secondary | ICD-10-CM | POA: Insufficient documentation

## 2013-01-02 ENCOUNTER — Telehealth: Payer: Self-pay

## 2013-01-02 NOTE — Telephone Encounter (Signed)
Patient states should he say on Ropinirole ? Patient sates he is still having shuffling gait.

## 2013-01-02 NOTE — Telephone Encounter (Signed)
I have called him, normal MRI brain was normal.  He is on titrating dose of requip, tolerating it well, continues to take it.

## 2013-03-02 ENCOUNTER — Encounter: Payer: Self-pay | Admitting: Neurology

## 2013-03-02 DIAGNOSIS — G20 Parkinson's disease: Secondary | ICD-10-CM

## 2013-03-02 DIAGNOSIS — E785 Hyperlipidemia, unspecified: Secondary | ICD-10-CM

## 2013-03-02 DIAGNOSIS — E1059 Type 1 diabetes mellitus with other circulatory complications: Secondary | ICD-10-CM

## 2013-03-02 DIAGNOSIS — I10 Essential (primary) hypertension: Secondary | ICD-10-CM

## 2013-03-02 DIAGNOSIS — G4730 Sleep apnea, unspecified: Secondary | ICD-10-CM

## 2013-03-03 ENCOUNTER — Ambulatory Visit: Payer: Self-pay | Admitting: Neurology

## 2013-03-21 ENCOUNTER — Ambulatory Visit (HOSPITAL_BASED_OUTPATIENT_CLINIC_OR_DEPARTMENT_OTHER): Payer: 59 | Admitting: Oncology

## 2013-03-21 ENCOUNTER — Other Ambulatory Visit (HOSPITAL_BASED_OUTPATIENT_CLINIC_OR_DEPARTMENT_OTHER): Payer: 59 | Admitting: Lab

## 2013-03-21 ENCOUNTER — Telehealth: Payer: Self-pay | Admitting: Oncology

## 2013-03-21 VITALS — BP 132/72 | HR 65 | Temp 97.5°F | Resp 18 | Ht 71.0 in | Wt 263.5 lb

## 2013-03-21 DIAGNOSIS — D72829 Elevated white blood cell count, unspecified: Secondary | ICD-10-CM

## 2013-03-21 LAB — CBC WITH DIFFERENTIAL/PLATELET
BASO%: 1.3 % (ref 0.0–2.0)
EOS%: 0.7 % (ref 0.0–7.0)
MCH: 29.2 pg (ref 27.2–33.4)
MCHC: 34.5 g/dL (ref 32.0–36.0)
NEUT%: 31.1 % — ABNORMAL LOW (ref 39.0–75.0)
RBC: 5.05 10*6/uL (ref 4.20–5.82)
RDW: 13.7 % (ref 11.0–14.6)
lymph#: 11.7 10*3/uL — ABNORMAL HIGH (ref 0.9–3.3)

## 2013-03-21 LAB — COMPREHENSIVE METABOLIC PANEL (CC13)
ALT: 23 U/L (ref 0–55)
AST: 15 U/L (ref 5–34)
Calcium: 9.4 mg/dL (ref 8.4–10.4)
Chloride: 104 mEq/L (ref 98–107)
Creatinine: 1 mg/dL (ref 0.7–1.3)
Potassium: 3.7 mEq/L (ref 3.5–5.1)
Sodium: 141 mEq/L (ref 136–145)

## 2013-03-21 NOTE — Telephone Encounter (Signed)
Gave pt appt for lab and MD for December 2014 °

## 2013-03-21 NOTE — Progress Notes (Signed)
Hematology and Oncology Follow Up Visit  Cory [name] [id number] Nov 29, 1950 62 y.o. 03/21/2013 8:51 AM   Principle Diagnosis: 62 year old with lymphocytosis. 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   Current therapy: Observation and surveillance.   Interim History: 62 year old man I am seeing in follow up. He has lymphocytosis and flow cytometry show possible CLL markers. He continued to be asymptomatic at this time He does report some tiredness on the weekend but able to drive. He had not reported any fevers, did not report any chills, had not reported any weight loss, appetite changes. He did not report any constitutional symptoms. He did not have any viral illnesses. Did not report any arthralgias and myalgias. He was diagnosed with Parkinson's disease but still very functional.    Medications: I have reviewed the patient's current medications.  Current Outpatient Prescriptions  Medication Sig Dispense Refill  . amLODipine (NORVASC) 10 MG tablet Take 10 mg by mouth Daily.      Marland Kitchen aspirin 81 MG tablet Take 81 mg by mouth daily.      Marland Kitchen atorvastatin (LIPITOR) 40 MG tablet Take 40 mg by mouth Daily.      Marland Kitchen glimepiride (AMARYL) 2 MG tablet Take 2 mg by mouth Daily.      Marland Kitchen losartan-hydrochlorothiazide (HYZAAR) 100-25 MG per tablet Take by mouth Daily.      . metFORMIN (GLUCOPHAGE-XR) 500 MG 24 hr tablet Take 500 mg by mouth.      . Multiple Vitamin (MULTIVITAMIN) capsule Take 1 capsule by mouth daily.      . Multiple Vitamins-Minerals (ICAPS MV PO) Take 1 capsule by mouth daily.      Marland Kitchen rOPINIRole (REQUIP) 2 MG tablet Take 2 mg by mouth at bedtime. Three (3) tablets at bedtime      . sildenafil (VIAGRA) 50 MG tablet Take 50 mg by mouth daily as needed.       No current facility-administered medications for this visit.    Allergies:  Allergies  Allergen Reactions  . Accupril (Quinapril Hcl)     Past Medical History, Surgical history, Social history, and Family History were reviewed and  updated.  Review of Systems: Remaining ROS negative.  Physical Exam: Blood pressure 132/72, pulse 65, temperature 97.5 F (36.4 C), temperature source Oral, resp. rate 18, height 5\' 11"  (1.803 m), weight 263 lb 8 oz (119.523 kg). ECOG: 0 General appearance: alert Head: Normocephalic, without obvious abnormality, atraumatic Neck: no adenopathy, no carotid bruit, no JVD, supple, symmetrical, trachea midline and thyroid not enlarged, symmetric, no tenderness/mass/nodules Lymph nodes: Cervical, supraclavicular, and axillary nodes normal. Heart:regular rate and rhythm, S1, S2 normal, no murmur, click, rub or gallop Lung:chest clear, no wheezing, rales, normal symmetric air entry Abdomin: soft, non-tender, without masses or organomegaly EXT:no erythema, induration, or nodules   Lab Results: Lab Results  Component Value Date   WBC 18.6* 03/21/2013   HGB 14.7 03/21/2013   HCT 42.8 03/21/2013   MCV 84.7 03/21/2013   PLT 319 03/21/2013     Chemistry      Component Value Date/Time   NA 141 09/20/2012 0859   NA 138 12/19/2007 2253   K 3.9 09/20/2012 0859   K 3.6 12/19/2007 2253   CL 103 09/20/2012 0859   CL 104 12/19/2007 2253   CO2 29 09/20/2012 0859   BUN 14.0 09/20/2012 0859   BUN 15 12/19/2007 2253   CREATININE 0.9 09/20/2012 0859   CREATININE 1.2 12/19/2007 2253      Component  Value Date/Time   CALCIUM 9.9 09/20/2012 0859   ALKPHOS 63 09/20/2012 0859   AST 19 09/20/2012 0859   ALT 30 09/20/2012 0859   BILITOT 0.66 09/20/2012 0859      Impression and Plan: This is a 62 year old gentleman with a mild leukocytosis and mild lymphocytosis. Flow cytometry did show B cell markers consistent with CLL.  At this point he continues to be asymptomatic. He has nothing to indicate treatment at this time.  I educated him about symptoms associated with CLL including adenopathy, autoimmune cytopenias and infections. I will continue active follow up and repeat exam and blood work in 6 months.      [facility], MD 6/10/20148:51 AM

## 2013-03-22 ENCOUNTER — Ambulatory Visit: Payer: Self-pay | Admitting: Neurology

## 2013-03-22 ENCOUNTER — Encounter: Payer: Self-pay | Admitting: Neurology

## 2013-03-22 ENCOUNTER — Ambulatory Visit (INDEPENDENT_AMBULATORY_CARE_PROVIDER_SITE_OTHER): Payer: 59 | Admitting: Neurology

## 2013-03-22 VITALS — BP 138/71 | HR 64 | Ht 71.0 in | Wt 262.0 lb

## 2013-03-22 DIAGNOSIS — G20 Parkinson's disease: Secondary | ICD-10-CM

## 2013-03-22 MED ORDER — ROPINIROLE HCL ER 6 MG PO TB24: 18.0000 mg | ORAL_TABLET | Freq: Every day | ORAL | Status: DC

## 2013-03-22 MED ORDER — RASAGILINE MESYLATE 1 MG PO TABS: 1.0000 mg | ORAL_TABLET | Freq: Every day | ORAL | Status: DC

## 2013-03-22 NOTE — Progress Notes (Signed)
History of Present Illness:   Cory [name] is a 62 years old right-handed Caucasian male, accompanied by his wife, referred by his primary care physician patterns loss for evaluation of possible Parkinson's disease.  He had past medical history of obesity, diabetes, hypertension, hyperlipidemia, obstructive sleep apnea, using CPAP machine, sedentary lifestyle  2 years ago, he developed intermittent mild erectile dysfunction, also noticed loss sense of smell, was considered due to CPAP machine use, wife also noticed he has excessive movement during his sleep, vivid dreams, acting and talking during dreams, he has occasionally lightheadedness when getting up from seated position, next  One year ago, he was noticed to have decreased stride while walking, decreased arm swing, he has fell couple times over the past couple years, but no clear reasons, there was no significant hands tremor  His maternal grandmother suffered Parkinson's disease, he works as a Musician a lot of driving,desk job, sedentary lifestyle  MRI brain (without contrast) demonstrating subtle left frontal subcortical focus (3mm) of gliosis.  He tolerated requip xr 2mg  3 tabs qhs well, no significant side effect, but benefit only lasted few days, no significant improvement noticed now, he has mild gait difficulty, fatigue easily,    Physical Exam  Neck: supple no carotid bruits Respiratory: clear to auscultation bilaterally Cardiovascular: regular rate rhythm  Neurologic Exam  Mental Status: obesity, decreased facial expression awake, alert, cooperative to history, talking, and casual conversation. Cranial Nerves: CN II-XII pupils were equal round reactive to light.  Fundi were sharp bilaterally.  Extraocular movements were full.  Visual fields were full on confrontational test.  Facial sensation and strength were normal.  Hearing was intact to finger rubbing bilaterally.  Uvula tongue were midline.  Head turning and  shoulder shrugging were normal and symmetric.  Tongue protrusion into the cheeks strength were normal.  Motor:  there was no significant resting tremor, mild right more than left limb and nuchal rigidity,mild early fatigabilitywith rapid wrist opening closure, finger tapping, Sensory: Normal to light touch, pinprick, proprioception, and vibratory sensation. Coordination: Normal finger-to-nose, heel-to-shin.  There was no dysmetria noticed. Gait and Station: Narrow based and steady, decreased bilateral arm swing,decreased stride, mild retropulse instability. Romberg sign: Negative Reflexes: Deep tendon reflexes: Biceps: 2/2, Brachioradialis: 2/2, Triceps: 2/2, Pateller: 2/2, Achilles: 1/1.  Plantar responses are flexor.  Assessment and Plan: 62 years old Caucasian male, with multiple vascular risk factors, including diabetes, hypertension hyperlipidemia, obesity, sedentary lifestyle, obstructive sleep apnea, presenting with 2 years history of loss of sense of smell, one-year history of mild gait difficulty, small shuffling gait, with decreased arm swing  1. Most consistent with idiopathic Parkinson's disease 2. titrating  ropinirole ER  6 mg  3 tablets every night 3. Add on Azilect 1mg  qday, moderate daily exercise 4. Return to clinic in 3 months

## 2013-03-26 ENCOUNTER — Other Ambulatory Visit: Payer: Self-pay

## 2013-03-26 MED ORDER — ROPINIROLE HCL ER 6 MG PO TB24: 18.0000 mg | ORAL_TABLET | Freq: Every day | ORAL | Status: DC

## 2013-03-26 MED ORDER — RASAGILINE MESYLATE 1 MG PO TABS: 1.0000 mg | ORAL_TABLET | Freq: Every day | ORAL | Status: DC

## 2013-07-17 ENCOUNTER — Encounter: Payer: Self-pay | Admitting: Neurology

## 2013-07-17 ENCOUNTER — Ambulatory Visit (INDEPENDENT_AMBULATORY_CARE_PROVIDER_SITE_OTHER): Payer: 59 | Admitting: Neurology

## 2013-07-17 DIAGNOSIS — G20 Parkinson's disease: Secondary | ICD-10-CM

## 2013-07-17 DIAGNOSIS — E785 Hyperlipidemia, unspecified: Secondary | ICD-10-CM

## 2013-07-17 NOTE — Progress Notes (Signed)
History of Present Illness:   Mr. Stillings is a 62 years old right-handed Caucasian male, accompanied by his wife, referred by his primary care physician patterns loss for evaluation of possible Parkinson's disease.  He had past medical history of obesity, diabetes, hypertension, hyperlipidemia, obstructive sleep apnea, using CPAP machine, sedentary lifestyle  2 years ago around 2012, he developed intermittent mild erectile dysfunction, also noticed loss sense of smell, was considered due to CPAP machine use, wife also noticed that he has excessive movement during his sleep, vivid dreams, acting and talking during dreams, he has occasionally lightheadedness when getting up from seated position   One year ago, he was noticed to have decreased stride while walking, decreased arm swing, he has fell couple times over the past couple years, but no clear reasons, there was no significant hands tremor  His maternal grandmother suffered Parkinson's disease, he works as a Musician a lot of driving,desk job, sedentary lifestyle  MRI brain (without contrast) demonstrating subtle left frontal subcortical focus (3mm) of gliosis.  He tolerated requip xr 2mg  3 tabs qhs well, no significant side effect, but benefit only lasted few days, no significant improvement noticed now, he has mild gait difficulty, fatigue easily  UPDATE Oct 6th 2014.  He is taking Requip XR 6mg  3 tabs qhs, no side effect. He is also taking Azilect 1mg  qday, he does not exercise, he has shuffling gait, small stride.  She is recovering from her colon cancer.     Physical Exam  Neck: supple no carotid bruits Respiratory: clear to auscultation bilaterally Cardiovascular: regular rate rhythm  Neurologic Exam  Mental Status: obesity, decreased facial expression awake, alert, cooperative to history, talking, and casual conversation. Cranial Nerves: CN II-XII pupils were equal round reactive to light.  Fundi were sharp  bilaterally.  Extraocular movements were full.  Visual fields were full on confrontational test.  Facial sensation and strength were normal.  Hearing was intact to finger rubbing bilaterally.  Uvula tongue were midline.  Head turning and shoulder shrugging were normal and symmetric.  Tongue protrusion into the cheeks strength were normal.  Motor:  there was no significant resting tremor, mild right more than left limb and nuchal rigidity,mild early fatigabilitywith rapid wrist opening closure, finger tapping, Sensory: Normal to light touch, pinprick, proprioception, and vibratory sensation. Coordination: Normal finger-to-nose, heel-to-shin.  There was no dysmetria noticed. Gait and Station: Narrow based and steady, decreased bilateral arm swing,decreased stride, mild retropulse instability. Romberg sign: Negative Reflexes: Deep tendon reflexes: Biceps: 2/2, Brachioradialis: 2/2, Triceps: 2/2, Pateller: 2/2, Achilles: 1/1.  Plantar responses are flexor.  Assessment and Plan: 62 years old Caucasian male, with multiple vascular risk factors, including diabetes, hypertension hyperlipidemia, obesity, sedentary lifestyle, obstructive sleep apnea, presenting with 2 years history of loss of sense of smell, one-year history of mild gait difficulty, small shuffling gait, with decreased arm swing  1. Most consistent with idiopathic Parkinson's disease 2. titrating  ropinirole ER  6 mg  3 tablets every night 3.  Azilect 1mg  qday, moderate daily exercise 4. Return to clinic in 6 month with Eber Jones

## 2013-08-17 ENCOUNTER — Other Ambulatory Visit: Payer: Self-pay

## 2013-09-20 ENCOUNTER — Other Ambulatory Visit (HOSPITAL_BASED_OUTPATIENT_CLINIC_OR_DEPARTMENT_OTHER): Payer: 59

## 2013-09-20 ENCOUNTER — Ambulatory Visit (HOSPITAL_BASED_OUTPATIENT_CLINIC_OR_DEPARTMENT_OTHER): Payer: 59 | Admitting: Oncology

## 2013-09-20 VITALS — BP 141/66 | HR 62 | Temp 97.0°F | Resp 18 | Ht 71.0 in | Wt 270.0 lb

## 2013-09-20 DIAGNOSIS — D72829 Elevated white blood cell count, unspecified: Secondary | ICD-10-CM

## 2013-09-20 DIAGNOSIS — G20 Parkinson's disease: Secondary | ICD-10-CM

## 2013-09-20 DIAGNOSIS — D72820 Lymphocytosis (symptomatic): Secondary | ICD-10-CM

## 2013-09-20 LAB — COMPREHENSIVE METABOLIC PANEL (CC13)
Alkaline Phosphatase: 59 U/L (ref 40–150)
BUN: 13.1 mg/dL (ref 7.0–26.0)
Glucose: 246 mg/dl — ABNORMAL HIGH (ref 70–140)
Sodium: 139 mEq/L (ref 136–145)
Total Bilirubin: 0.59 mg/dL (ref 0.20–1.20)

## 2013-09-20 LAB — MANUAL DIFFERENTIAL
ALC: 15.8 10*3/uL — ABNORMAL HIGH (ref 0.9–3.3)
ANC (CHCC manual diff): 5.2 10*3/uL (ref 1.5–6.5)
Band Neutrophils: 0 % (ref 0–10)
Blasts: 0 % (ref 0–0)
LYMPH: 73 % — ABNORMAL HIGH (ref 14–49)
SEG: 22 % — ABNORMAL LOW (ref 38–77)
Variant Lymph: 0 % (ref 0–0)
nRBC: 0 % (ref 0–0)

## 2013-09-20 LAB — CBC WITH DIFFERENTIAL/PLATELET
HCT: 42.3 % (ref 38.4–49.9)
MCH: 29 pg (ref 27.2–33.4)
MCHC: 33.1 g/dL (ref 32.0–36.0)
Platelets: 314 10*3/uL (ref 140–400)

## 2013-09-20 NOTE — Addendum Note (Signed)
Addended by: Reesa Chew on: 09/20/2013 08:55 AM   Modules accepted: Medications

## 2013-09-20 NOTE — Progress Notes (Signed)
Hematology and Oncology Follow Up Visit  Cory [name] [id number] [birth year] 62 y.o. 09/20/2013 8:34 AM   Principle Diagnosis: 62 year old with lymphocytosis likely due to early stage CLL diagnosed in 2013.   Current therapy: Observation and surveillance.   Interim History: 62 year old man I am seeing in follow up visit with his wife. He has lymphocytosis and flow cytometry show possible CLL markers. 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 He had not reported any fevers, did not report any chills, had not reported any weight loss, appetite changes. He did not report any constitutional symptoms. He did not have any viral illnesses. Did not report any arthralgias and myalgias. He was diagnosed with Parkinson's disease but still very functional and follows up with neurology regarding that.   Medications: I have reviewed the patient's current medications.  Current Outpatient Prescriptions  Medication Sig Dispense Refill  . amLODipine (NORVASC) 10 MG tablet Take 10 mg by mouth Daily.      Marland Kitchen aspirin 81 MG tablet Take 81 mg by mouth daily.      Marland Kitchen atorvastatin (LIPITOR) 40 MG tablet Take 40 mg by mouth Daily.      Marland Kitchen glimepiride (AMARYL) 2 MG tablet Take 2 mg by mouth Daily.      Marland Kitchen losartan-hydrochlorothiazide (HYZAAR) 100-25 MG per tablet Take by mouth Daily.      . metFORMIN (GLUCOPHAGE-XR) 500 MG 24 hr tablet Take 500 mg by mouth 2 (two) times daily.       . Multiple Vitamin (MULTIVITAMIN) capsule Take 1 capsule by mouth daily.      . Multiple Vitamins-Minerals (ICAPS MV PO) Take 1 capsule by mouth daily.      . rasagiline (AZILECT) 1 MG TABS Take 1 tablet (1 mg total) by mouth daily.  90 tablet  3  . Ropinirole HCl 6 MG TB24 Take 3 tablets (18 mg total) by mouth at bedtime.  270 tablet  3  . sildenafil (VIAGRA) 50 MG tablet Take 50 mg by mouth daily as needed.       No current facility-administered  medications for this visit.    Allergies:  Allergies  Allergen Reactions  . Accupril [Quinapril Hcl]     Past Medical History, Surgical history, Social history, and Family History were reviewed and updated.  Review of Systems: Remaining ROS negative.  Physical Exam: Blood pressure 141/66, pulse 62, temperature 97 F (36.1 C), temperature source Oral, resp. rate 18, height 5\' 11"  (1.803 m), weight 270 lb (122.471 kg), SpO2 98.00%. ECOG: 0 General appearance: alert Head: Normocephalic, without obvious abnormality, atraumatic Neck: no adenopathy, no carotid bruit, no JVD, supple, symmetrical, trachea midline and thyroid not enlarged, symmetric, no tenderness/mass/nodules Lymph nodes: Cervical, supraclavicular, and axillary nodes normal. Heart:regular rate and rhythm, S1, S2 normal, no murmur, click, rub or gallop Lung:chest clear, no wheezing, rales, normal symmetric air entry Abdomin: soft, non-tender, without masses or organomegaly EXT:no erythema, induration, or nodules Neurological exam: No deficits noted grossly.  Lab Results: Lab Results  Component Value Date   WBC 21.6* 09/20/2013   HGB 14.0 09/20/2013   HCT 42.3 09/20/2013   MCV 87.4 09/20/2013   PLT 314 09/20/2013     Chemistry      Component Value Date/Time   NA 141 03/21/2013 0818   NA 138 12/19/2007 2253   K 3.7 03/21/2013 0818   K 3.6 12/19/2007 2253  CL 104 03/21/2013 0818   CL 104 12/19/2007 2253   CO2 26 03/21/2013 0818   BUN 14.3 03/21/2013 0818   BUN 15 12/19/2007 2253   CREATININE 1.0 03/21/2013 0818   CREATININE 1.2 12/19/2007 2253      Component Value Date/Time   CALCIUM 9.4 03/21/2013 0818   ALKPHOS 54 03/21/2013 0818   AST 15 03/21/2013 0818   ALT 23 03/21/2013 0818   BILITOT 0.64 03/21/2013 0818      Impression and Plan: This is a 62 year old gentleman with: 1. Leukocytosis and mild lymphocytosis. Flow cytometry did show B cell markers consistent with CLL. At this point he continues to be asymptomatic.  He has nothing to indicate treatment at this time. I educated him about symptoms associated with CLL including adenopathy, autoimmune cytopenias and infections. I will continue active follow up and repeat exam and blood work in 6 months.   2. Parkinson's: He follows with neurology and continue to be functional at this time.    [facility], MD 12/10/20148:34 AM

## 2013-10-02 ENCOUNTER — Other Ambulatory Visit: Payer: Self-pay | Admitting: Dermatology

## 2014-01-19 ENCOUNTER — Ambulatory Visit (INDEPENDENT_AMBULATORY_CARE_PROVIDER_SITE_OTHER): Payer: 59 | Admitting: Nurse Practitioner

## 2014-01-19 ENCOUNTER — Encounter: Payer: Self-pay | Admitting: Nurse Practitioner

## 2014-01-19 VITALS — BP 146/79 | HR 62 | Ht 70.25 in | Wt 249.0 lb

## 2014-01-19 DIAGNOSIS — E785 Hyperlipidemia, unspecified: Secondary | ICD-10-CM

## 2014-01-19 DIAGNOSIS — G20 Parkinson's disease: Secondary | ICD-10-CM

## 2014-01-19 DIAGNOSIS — G4730 Sleep apnea, unspecified: Secondary | ICD-10-CM

## 2014-01-19 DIAGNOSIS — E1059 Type 1 diabetes mellitus with other circulatory complications: Secondary | ICD-10-CM

## 2014-01-19 MED ORDER — RASAGILINE MESYLATE 1 MG PO TABS: 1.0000 mg | ORAL_TABLET | Freq: Every day | ORAL | Status: DC

## 2014-01-19 MED ORDER — ROPINIROLE HCL ER 6 MG PO TB24: 18.0000 mg | ORAL_TABLET | Freq: Every day | ORAL | Status: DC

## 2014-01-19 NOTE — Progress Notes (Signed)
[facility]  PATIENT: Cory [name] DOB: [birth year]   REASON FOR VISIT: follow up for Parkinsons disease    HISTORY OF PRESENT ILLNESS: Mr. Sahni, 63 year old male returns for followup. He has a history of idiopathic Parkinson's disease. He claims he lost his sense of smell about 2 years ago. He has never had significant tremor. He does notice vivid dreams and excessive movement during sleep. He has obstructive sleep apnea and uses CPAP but his settings have never been changed. He is a Hotel manager. He gets no regular exercise and has a sedentary lifestyle. He is not fallen recently. He is currently on ropinirole 18 mg daily ER and Azilect was added at his last visit. He is doing well on this combination. He returns for reevaluation  HISTORY:evaluation of possible Parkinson's disease.  He had past medical history of obesity, diabetes, hypertension, hyperlipidemia, obstructive sleep apnea, using CPAP machine, sedentary lifestyle  2 years ago around 2012, he developed intermittent mild erectile dysfunction, also noticed loss sense of smell, was considered due to CPAP machine use, wife also noticed that he has excessive movement during his sleep, vivid dreams, acting and talking during dreams, he has occasionally lightheadedness when getting up from seated position  One year ago, he was noticed to have decreased stride while walking, decreased arm swing, he has fell couple times over the past couple years, but no clear reasons, there was no significant hands tremor  His maternal grandmother suffered Parkinson's disease, he works as a Personal assistant a lot of driving,desk job, sedentary lifestyle  MRI brain (without contrast) demonstrating subtle left frontal subcortical focus (31mm) of gliosis.  He tolerated requip xr 2mg  3 tabs qhs well, no significant side effect, but benefit only lasted few days, no significant improvement noticed now, he has mild gait difficulty, fatigue  easily  UPDATE Oct 6th 2014.  He is taking Requip XR 6mg  3 tabs qhs, no side effect. He is also taking Azilect 1mg  qday, he does not exercise, he has shuffling gait, small stride. He is recovering from colon cancer.   REVIEW OF SYSTEMS: Full 14 system review of systems performed and notable only for those listed, all others are neg:  Constitutional: N/A  Cardiovascular: Leg swelling Ear/Nose/Throat: N/A  Skin: N/A  Eyes: N/A  Respiratory: N/A  Gastroitestinal: N/A  Hematology/Lymphatic: N/A  Endocrine: Excessive thirst  Musculoskeletal:N/A  Allergy/Immunology: N/A  Neurological: N/A Psychiatric: N/A Sleep obstructive sleep apnea   ALLERGIES: Allergies  Allergen Reactions  . Accupril [Quinapril Hcl]     HOME MEDICATIONS: Outpatient Prescriptions Prior to Visit  Medication Sig Dispense Refill  . aspirin 81 MG tablet Take 81 mg by mouth daily.      Marland Kitchen atorvastatin (LIPITOR) 40 MG tablet Take 40 mg by mouth Daily.      Marland Kitchen glimepiride (AMARYL) 2 MG tablet Take 2 mg by mouth Daily.      Marland Kitchen losartan-hydrochlorothiazide (HYZAAR) 100-25 MG per tablet Take by mouth Daily.      . metFORMIN (GLUCOPHAGE-XR) 500 MG 24 hr tablet Take 500 mg by mouth 2 (two) times daily.       . Multiple Vitamin (MULTIVITAMIN) capsule Take 1 capsule by mouth daily.      . Multiple Vitamins-Minerals (ICAPS MV PO) Take 1 capsule by mouth daily.      . rasagiline (AZILECT) 1 MG TABS Take 1 tablet (1 mg total) by mouth daily.  90 tablet  3  . Ropinirole HCl 6 MG TB24 Take 3 tablets (18 mg  total) by mouth at bedtime.  270 tablet  3  . sildenafil (VIAGRA) 50 MG tablet Take 50 mg by mouth daily as needed.      Marland Kitchen amLODipine (NORVASC) 10 MG tablet Take 10 mg by mouth Daily.       No facility-administered medications prior to visit.    PAST MEDICAL HISTORY: Past Medical History  Diagnosis Date  . Hypertension   . Diabetes   . High cholesterol   . Sleep apnea   . Lymphoplasmacytoid lymphoma, CLL     Pre     PAST SURGICAL HISTORY: Past Surgical History  Procedure Laterality Date  . Knee surgery Left 2014  . Tonsillectomy  1952    FAMILY HISTORY: Family History  Problem Relation Age of Onset  . Stroke Father   . Heart attack Father   . Sleep apnea Mother   . Cancer Sister     Breast cancer  . Diabetes Sister   . Sleep apnea Brother     SOCIAL HISTORY: History   Social History  . Marital Status: Married    Spouse Name: N/A    Number of Children: 1  . Years of Education: BS   Occupational History  .      Works at [employer] Topics  . Smoking status: Never Smoker   . Smokeless tobacco: Former Systems developer    Types: Snuff    Quit date: 03/22/1989  . Alcohol Use: Yes     Comment: Consumes alcohol on occasion  . Drug Use: No  . Sexual Activity: Not on file   Other Topics Concern  . Not on file   Social History Narrative   Pt is here today for his 4 mo check up.Pt is married and does still work at this time.   Married, 1 children   Left handed   BS degree   Drinks caffeine free           PHYSICAL EXAM  Filed Vitals:   01/19/14 0836  BP: 146/79  Pulse: 62  Height: 5' 10.25" (1.784 m)  Weight: 249 lb (112.946 kg)   Body mass index is 35.49 kg/(m^2).  Generalized: Well developed, obese male in no acute distress  Head: normocephalic and atraumatic,. Oropharynx benign  Neck: Supple, no carotid bruits  Cardiac: Regular rate rhythm, no murmur  Musculoskeletal: No deformity   Neurological examination   Mentation: Alert oriented to time, place, history taking. Follows all commands speech and language fluent  Cranial nerve II-XII: Pupils were equal round reactive to light extraocular movements were full, visual field were full on confrontational test. Facial sensation and strength were normal. hearing was intact to finger rubbing bilaterally. Uvula tongue midline. head turning and shoulder shrug were normal and symmetric.Tongue protrusion into  cheek strength was normal. Motor: normal bulk and tone, full strength in the BUE, BLE, fine finger movements normal, no pronator drift. No focal weakness, no resting tremor, no bradykinesia Coordination: finger-nose-finger, heel-to-shin bilaterally, no dysmetria Reflexes: Brachioradialis 2/2, biceps 2/2, triceps 2/2, patellar 2/2, Achilles 1/1, plantar responses were flexor bilaterally. Gait and Station: Rising up from seated position without assistance, normal stance,  moderate stride, decreased arm swing bilaterally, ambulating 210 feet in 55 seconds no difficulty with turns  DIAGNOSTIC DATA (LABS, IMAGING, TESTING) - I reviewed patient records, labs, notes, testing and imaging myself where available.  Lab Results  Component Value Date   WBC 21.6* 09/20/2013   HGB 14.0 09/20/2013   HCT 42.3 09/20/2013  MCV 87.4 09/20/2013   PLT 314 09/20/2013      Component Value Date/Time   NA 139 09/20/2013 0803   NA 138 12/19/2007 2253   K 3.5 09/20/2013 0803   K 3.6 12/19/2007 2253   CL 104 03/21/2013 0818   CL 104 12/19/2007 2253   CO2 26 09/20/2013 0803   GLUCOSE 246* 09/20/2013 0803   GLUCOSE 205* 03/21/2013 0818   GLUCOSE 172* 12/19/2007 2253   BUN 13.1 09/20/2013 0803   BUN 15 12/19/2007 2253   CREATININE 1.0 09/20/2013 0803   CREATININE 1.2 12/19/2007 2253   CALCIUM 9.7 09/20/2013 0803   PROT 6.6 09/20/2013 0803   ALBUMIN 4.0 09/20/2013 0803   AST 18 09/20/2013 0803   ALT 24 09/20/2013 0803   ALKPHOS 59 09/20/2013 0803   BILITOT 0.59 09/20/2013 0803     ASSESSMENT AND PLAN  63 y.o. year old male  has a past medical history of Hypertension; Diabetes; High cholesterol; Sleep apnea; and Lymphoplasmacytoid lymphoma, CLL. and gait abnormality most consistent with idiopathic Parkinson's disease here for followup. He is a patient of Dr. Krista Blue who is out of the office today.  Continue ropinirole at current dose will refill Continue Azilect  at current dose, will refill Given patient information  on rapid eye movement sleep disorder behavior Try exercise by walking at least daily, this is better than medications for Parkinson's disease Followup in 6 months Dennie Bible, [facility], [facility] [place], [facility] [address], [place], [place] [address] [phone]

## 2014-01-19 NOTE — Patient Instructions (Signed)
Continue ropinirole at current dose Continue Azilect  at current dose Given patient information on rapid eye movement sleep disorder behavior Try exercise by walking at least daily, this is better than medications for Parkinson's disease Followup in 6 months

## 2014-03-21 ENCOUNTER — Encounter: Payer: Self-pay | Admitting: Oncology

## 2014-03-21 ENCOUNTER — Other Ambulatory Visit (HOSPITAL_BASED_OUTPATIENT_CLINIC_OR_DEPARTMENT_OTHER): Payer: 59

## 2014-03-21 ENCOUNTER — Telehealth: Payer: Self-pay | Admitting: Oncology

## 2014-03-21 ENCOUNTER — Ambulatory Visit (HOSPITAL_BASED_OUTPATIENT_CLINIC_OR_DEPARTMENT_OTHER): Payer: 59 | Admitting: Oncology

## 2014-03-21 VITALS — BP 154/78 | HR 59 | Temp 97.7°F | Resp 20 | Ht 70.25 in | Wt 264.4 lb

## 2014-03-21 DIAGNOSIS — C9110 Chronic lymphocytic leukemia of B-cell type not having achieved remission: Secondary | ICD-10-CM

## 2014-03-21 DIAGNOSIS — D72829 Elevated white blood cell count, unspecified: Secondary | ICD-10-CM

## 2014-03-21 LAB — CBC WITH DIFFERENTIAL/PLATELET
BASO%: 0.7 % (ref 0.0–2.0)
BASOS ABS: 0.2 10*3/uL — AB (ref 0.0–0.1)
EOS%: 0.8 % (ref 0.0–7.0)
Eosinophils Absolute: 0.2 10*3/uL (ref 0.0–0.5)
HEMATOCRIT: 43.5 % (ref 38.4–49.9)
HEMOGLOBIN: 14.3 g/dL (ref 13.0–17.1)
LYMPH#: 19.3 10*3/uL — AB (ref 0.9–3.3)
LYMPH%: 74.1 % — ABNORMAL HIGH (ref 14.0–49.0)
MCH: 28.3 pg (ref 27.2–33.4)
MCHC: 32.9 g/dL (ref 32.0–36.0)
MCV: 86.2 fL (ref 79.3–98.0)
MONO#: 1.5 10*3/uL — AB (ref 0.1–0.9)
MONO%: 5.6 % (ref 0.0–14.0)
NEUT%: 18.8 % — AB (ref 39.0–75.0)
NEUTROS ABS: 4.9 10*3/uL (ref 1.5–6.5)
Platelets: 280 10*3/uL (ref 140–400)
RBC: 5.05 10*6/uL (ref 4.20–5.82)
RDW: 14 % (ref 11.0–14.6)
WBC: 26.1 10*3/uL — AB (ref 4.0–10.3)

## 2014-03-21 LAB — COMPREHENSIVE METABOLIC PANEL (CC13)
ALBUMIN: 4 g/dL (ref 3.5–5.0)
ALT: 22 U/L (ref 0–55)
ANION GAP: 9 meq/L (ref 3–11)
AST: 16 U/L (ref 5–34)
Alkaline Phosphatase: 57 U/L (ref 40–150)
BUN: 15.9 mg/dL (ref 7.0–26.0)
CALCIUM: 9.6 mg/dL (ref 8.4–10.4)
CHLORIDE: 103 meq/L (ref 98–109)
CO2: 28 meq/L (ref 22–29)
Creatinine: 1.1 mg/dL (ref 0.7–1.3)
Glucose: 145 mg/dl — ABNORMAL HIGH (ref 70–140)
POTASSIUM: 4 meq/L (ref 3.5–5.1)
Sodium: 141 mEq/L (ref 136–145)
Total Bilirubin: 0.57 mg/dL (ref 0.20–1.20)
Total Protein: 6.6 g/dL (ref 6.4–8.3)

## 2014-03-21 LAB — TECHNOLOGIST REVIEW

## 2014-03-21 NOTE — Telephone Encounter (Signed)
gave pt apt for December 2015

## 2014-03-21 NOTE — Progress Notes (Signed)
Hematology and Oncology Follow Up Visit  Cory [name] [id number] 1951-02-13 63 y.o. 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 His diagnosis confirmed by flow cytometry.  Current therapy: Observation and surveillance.   Interim History: 63 year old man presents for follow up visit with his wife. Since his last visit, he continued to be asymptomatic at this time He does report some fatigue at times but able to work full-time. He had not reported any fevers, did not report any chills, had not reported any weight loss, appetite changes. He did not report any constitutional symptoms. He did not report any headaches or blurry vision or double vision. Did not report any syncope. He did not report any chest pain shortness of breath or difficulty breathing. He does not report any abdominal pain or diarrhea. He does report occasional constipation that is easily managed. Is not reporting any hematuria or dysuria. Did not report any arthralgias and myalgias. He was diagnosed with Parkinson's disease but still very functional. He has not reported any hospitalization or illnesses. Rest of his review of systems unremarkable.   Medications: I have reviewed the patient's current medications. Unchanged by my review. Current Outpatient Prescriptions  Medication Sig Dispense Refill  . aspirin 81 MG tablet Take 81 mg by mouth daily.      Marland Kitchen atorvastatin (LIPITOR) 40 MG tablet Take 40 mg by mouth Daily.      . carvedilol (COREG) 3.125 MG tablet Take 3.125 mg by mouth 2 (two) times daily with a meal.      . glimepiride (AMARYL) 2 MG tablet Take 2 mg by mouth Daily.      Marland Kitchen losartan-hydrochlorothiazide (HYZAAR) 100-25 MG per tablet Take by mouth Daily.      . metFORMIN (GLUCOPHAGE-XR) 500 MG 24 hr tablet Take 500 mg by mouth 2 (two) times daily.       . Multiple Vitamin (MULTIVITAMIN) capsule Take 1 capsule by mouth daily.      . Multiple  Vitamins-Minerals (ICAPS MV PO) Take 1 capsule by mouth daily.      . naproxen sodium (ANAPROX) 220 MG tablet Take 220 mg by mouth 2 (two) times daily with a meal.      . rasagiline (AZILECT) 1 MG TABS tablet Take 1 tablet (1 mg total) by mouth daily.  90 tablet  3  . Ropinirole HCl 6 MG TB24 Take 3 tablets (18 mg total) by mouth at bedtime.  270 tablet  3  . sildenafil (VIAGRA) 50 MG tablet Take 50 mg by mouth daily as needed.      . tolterodine (DETROL LA) 4 MG 24 hr capsule Take 4 mg by mouth daily.       No current facility-administered medications for this visit.    Allergies:  Allergies  Allergen Reactions  . Accupril [Quinapril Hcl]     Past Medical History, Surgical history, Social history, and Family History were reviewed and updated.   Physical Exam: Blood pressure 154/78, pulse 59, temperature 97.7 F (36.5 C), temperature source Oral, resp. rate 20, height 5' 10.25" (1.784 m), weight 264 lb 6.4 oz (119.931 kg). ECOG: 0 General appearance: alert and awake not in any distress. Head: Normocephalic, without obvious abnormality, atraumatic Neck: no adenopathy Lymph nodes: Cervical, supraclavicular, and axillary nodes normal. Heart:regular rate and rhythm, S1, S2 normal, no murmur, click, rub or gallop Lung:chest clear, no wheezing, rales, normal symmetric air entry no dullness to  percussion. Abdomin: soft, non-tender, without masses or organomegaly no shifting dullness or fluid shift. EXT:no erythema, induration, or nodules Skin: No rashes or lesions.  Lab Results: Lab Results  Component Value Date   WBC 26.1* 03/21/2014   HGB 14.3 03/21/2014   HCT 43.5 03/21/2014   MCV 86.2 03/21/2014   PLT 280 03/21/2014     Chemistry      Component Value Date/Time   NA 139 09/20/2013 0803   NA 138 12/19/2007 2253   K 3.5 09/20/2013 0803   K 3.6 12/19/2007 2253   CL 104 03/21/2013 0818   CL 104 12/19/2007 2253   CO2 26 09/20/2013 0803   BUN 13.1 09/20/2013 0803   BUN 15 12/19/2007 2253    CREATININE 1.0 09/20/2013 0803   CREATININE 1.2 12/19/2007 2253      Component Value Date/Time   CALCIUM 9.7 09/20/2013 0803   ALKPHOS 59 09/20/2013 0803   AST 18 09/20/2013 0803   ALT 24 09/20/2013 0803   BILITOT 0.59 09/20/2013 0803      Impression and Plan:  This is a 63 year old gentleman with:  1. Leukocytosis and mild lymphocytosis. Flow cytometry did show B cell markers consistent with CLL. His laboratory data did not changed dramatically and his lymphocyte percentage slightly increased to 74%. He continues to be asymptomatic without any indication for treatment at this time. I educated him about symptoms associated with CLL including adenopathy, autoimmune cytopenias and infections. As well as constitutional symptoms of fevers, chills and weight loss. I will continue active follow up and repeat exam and blood work in 6 months.   2. Parkinson's: Appears to be controlled at this time.    [facility], MD 6/10/20158:32 AM

## 2014-07-27 ENCOUNTER — Ambulatory Visit (INDEPENDENT_AMBULATORY_CARE_PROVIDER_SITE_OTHER): Payer: 59 | Admitting: Adult Health

## 2014-07-27 ENCOUNTER — Other Ambulatory Visit: Payer: Self-pay

## 2014-07-27 ENCOUNTER — Encounter: Payer: Self-pay | Admitting: Adult Health

## 2014-07-27 VITALS — BP 129/77 | HR 61 | Ht 71.0 in | Wt 267.0 lb

## 2014-07-27 DIAGNOSIS — G20 Parkinson's disease: Secondary | ICD-10-CM

## 2014-07-27 NOTE — Progress Notes (Signed)
PATIENT: Cory [name] DOB: Dec 16, 1950  REASON FOR VISIT: follow up HISTORY FROM: patient  HISTORY OF PRESENT ILLNESS: Cory [name] is a 63 year old male with a history of parkinsons disease. He returns today for follow-up. He is currently taking requip and azilect and tolerating it well. Gait and balance and remained the same. His wife feels that his walking as improved. He does feel that his balance is off some in the morning. He states that he gets straight out of bed and will have a "whoozy" feeling. Denies any issues with swallowing. Denies drooling. Patient denies having tremor. Patient is having a hard time iniating sleep. He states that he would like to be in bed by 10:00 but often he does not fall asleep till 12:00AM. He does state that he is have some issues with constipation. States that he will go 3-4 days before having a bowel movement. He tries to eat Bran cereal in the morning.  Patient is extremely worried about his memory. He feels that he is going loose his memory and would not even realize it. His wife  Has not noticed any big change in his memory. He continues to operate a motor vehicle. He states that he works in [employer] and drives everyday. His wife feels that his reaction time has decreased and she states he changes lanes often. He has not had any accidents while driving.   HISTORY (YY): 63 years old right-handed Caucasian male, accompanied by his wife, referred by his primary care physician patterns loss for evaluation of possible Parkinson's disease.  He had past medical history of obesity, diabetes, hypertension, hyperlipidemia, obstructive sleep apnea, using CPAP machine, sedentary lifestyle  2 years ago around 2012, he developed intermittent mild erectile dysfunction, also noticed loss sense of smell, was considered due to CPAP machine use, wife also noticed that he has excessive movement during his sleep, vivid dreams, acting and talking during dreams, he has occasionally  lightheadedness when getting up from seated position  One year ago, he was noticed to have decreased stride while walking, decreased arm swing, he has fell couple times over the past couple years, but no clear reasons, there was no significant hands tremor  His maternal grandmother suffered Parkinson's disease, he works as a Personal assistant a lot of driving,desk job, sedentary lifestyle  MRI brain (without contrast) demonstrating subtle left frontal subcortical focus (70mm) of gliosis.  He tolerated requip xr 2mg  3 tabs qhs well, no significant side effect, but benefit only lasted few days, no significant improvement noticed now, he has mild gait difficulty, fatigue easily  UPDATE Oct 6th 2014.  He is taking Requip XR 6mg  3 tabs qhs, no side effect. He is also taking Azilect 1mg  qday, he does not exercise, he has shuffling gait, small stride. She is recovering from her colon cancer   REVIEW OF SYSTEMS: Full 14 system review of systems performed and notable only for:  Constitutional: Excessive sweating Eyes: N/A Ear/Nose/Throat: N/A  Skin: N/A  Cardiovascular: Leg swelling Respiratory: N/A  Gastrointestinal: Constipation Genitourinary: Frequency of urination Hematology/Lymphatic: N/A  Endocrine: N/A Musculoskeletal: Joint pain, muscle cramps, neck pain, neck stiffness Allergy/Immunology: N/A  Neurological: N/A Psychiatric: N/A Sleep: Restless leg, apnea, frequent waking   ALLERGIES: Allergies  Allergen Reactions  . Accupril [Quinapril Hcl]     HOME MEDICATIONS: Outpatient Prescriptions Prior to Visit  Medication Sig Dispense Refill  . aspirin 81 MG tablet Take 81 mg by mouth daily.      Marland Kitchen atorvastatin (LIPITOR)  40 MG tablet Take 40 mg by mouth Daily.      . carvedilol (COREG) 3.125 MG tablet Take 3.125 mg by mouth 2 (two) times daily with a meal.      . glimepiride (AMARYL) 2 MG tablet Take 2 mg by mouth Daily.      Marland Kitchen losartan-hydrochlorothiazide (HYZAAR) 100-25 MG per  tablet Take by mouth Daily.      . metFORMIN (GLUCOPHAGE-XR) 500 MG 24 hr tablet Take 500 mg by mouth 2 (two) times daily.       . Multiple Vitamin (MULTIVITAMIN) capsule Take 1 capsule by mouth daily.      . Multiple Vitamins-Minerals (ICAPS MV PO) Take 1 capsule by mouth daily.      . rasagiline (AZILECT) 1 MG TABS tablet Take 1 tablet (1 mg total) by mouth daily.  90 tablet  3  . Ropinirole HCl 6 MG TB24 Take 3 tablets (18 mg total) by mouth at bedtime.  270 tablet  3  . sildenafil (VIAGRA) 50 MG tablet Take 50 mg by mouth daily as needed.      . tolterodine (DETROL LA) 4 MG 24 hr capsule Take 4 mg by mouth daily.      . naproxen sodium (ANAPROX) 220 MG tablet Take 220 mg by mouth 2 (two) times daily with a meal.       No facility-administered medications prior to visit.    PAST MEDICAL HISTORY: Past Medical History  Diagnosis Date  . Hypertension   . Diabetes   . High cholesterol   . Sleep apnea   . Lymphoplasmacytoid lymphoma, CLL     Pre    PAST SURGICAL HISTORY: Past Surgical History  Procedure Laterality Date  . Knee surgery Left 2014  . Tonsillectomy  1952    FAMILY HISTORY: Family History  Problem Relation Age of Onset  . Stroke Father   . Heart attack Father   . Sleep apnea Mother   . Cancer Sister     Breast cancer  . Diabetes Sister   . Sleep apnea Brother     SOCIAL HISTORY: History   Social History  . Marital Status: Married    Spouse Name: N/A    Number of Children: 1  . Years of Education: BS   Occupational History  .      Works at [employer] Topics  . Smoking status: Never Smoker   . Smokeless tobacco: Former Systems developer    Types: Snuff    Quit date: 03/22/1989  . Alcohol Use: Yes     Comment: Consumes alcohol on occasion  . Drug Use: No  . Sexual Activity: Not on file   Other Topics Concern  . Not on file   Social History Narrative   Pt is here today for his 4 mo check up.Pt is married and does still work at this  time.   Married, 1 children   Left handed   BS degree   Drinks caffeine free            PHYSICAL EXAM  Filed Vitals:   07/27/14 1047  BP: 129/77  Pulse: 61  Height: 5\' 11"  (1.803 m)  Weight: 267 lb (121.11 kg)   Body mass index is 37.26 kg/(m^2).  Generalized: Well developed, in no acute distress   Neurological examination  Mentation: Alert oriented to time, place, history taking. Follows all commands speech and language fluent Cranial nerve II-XII: Pupils were equal round reactive to light. Extraocular  movements were full, visual field were full on confrontational test. Facial sensation and strength were normal. hearing was intact to finger rubbing bilaterally. Uvula tongue midline. Head turning and shoulder shrug  were normal and symmetric.T Motor: The motor testing reveals 5 over 5 strength of all 4 extremities. Good symmetric motor tone is noted throughout.  Sensory: Sensory testing is intact to soft touch on all 4 extremities. No evidence of extinction is noted.  Coordination: Cerebellar testing reveals good finger-nose-finger and heel-to-shin bilaterally.  Gait and station: Patient is able to stand from a sitting position without assistance. Gait is normal, slightly stooped posture, decreased arm swing while walking. Good turns. Tandem gait is normal. Romberg is negative. No drift is seen.  Reflexes: Deep tendon reflexes are symmetric and normal bilaterally.    DIAGNOSTIC DATA (LABS, IMAGING, TESTING) - I reviewed patient records, labs, notes, testing and imaging myself where available.  Lab Results  Component Value Date   WBC 26.1* 03/21/2014   HGB 14.3 03/21/2014   HCT 43.5 03/21/2014   MCV 86.2 03/21/2014   PLT 280 03/21/2014      Component Value Date/Time   NA 141 03/21/2014 0810   NA 138 12/19/2007 2253   K 4.0 03/21/2014 0810   K 3.6 12/19/2007 2253   CL 104 03/21/2013 0818   CL 104 12/19/2007 2253   CO2 28 03/21/2014 0810   GLUCOSE 145* 03/21/2014 0810   GLUCOSE  205* 03/21/2013 0818   GLUCOSE 172* 12/19/2007 2253   BUN 15.9 03/21/2014 0810   BUN 15 12/19/2007 2253   CREATININE 1.1 03/21/2014 0810   CREATININE 1.2 12/19/2007 2253   CALCIUM 9.6 03/21/2014 0810   PROT 6.6 03/21/2014 0810   ALBUMIN 4.0 03/21/2014 0810   AST 16 03/21/2014 0810   ALT 22 03/21/2014 0810   ALKPHOS 57 03/21/2014 0810   BILITOT 0.57 03/21/2014 0810   Lab Results  Component Value Date   CHOL  Value: 121        ATP III CLASSIFICATION:  <200     mg/dL   Desirable  200-239  mg/dL   Borderline High  >=240    mg/dL   High 12/20/2007   HDL 39* 12/20/2007   LDLCALC  Value: 69        Total Cholesterol/HDL:CHD Risk Coronary Heart Disease Risk Table                     Men   Women  1/2 Average Risk   3.4   3.3 12/20/2007   TRIG 65 12/20/2007   CHOLHDL 3.1 12/20/2007   ASSESSMENT AND PLAN 63 y.o. year old male  has a past medical history of Hypertension; Diabetes; High cholesterol; Sleep apnea; and Lymphoplasmacytoid lymphoma, CLL. here with:  1. Parkinson's disease  Overall patient is doing very well. He is currently taken Azilect and Requip and tolerating it well. The patient brought in a list of questions today regarding symptoms he was concerned about. I have reviewed these things with him. I did a memory test on the patient and it was normal. His MMSE was 29/30 and MOCA is 29/30. Patient does have some trouble initiating sleep at night. I suggested that he try over-the-counter melatonin 5-10 mg. If this is not beneficial he should let us know. Patient also concerned about constipation. I made some recommendations for his diet. If those are nonbeneficial that he can try an over the  counter stool softener. Overall the patient is doing well. He  is very conscious of his symptoms. I advised the patient that he can contact us at any time if he has questions about new symptoms. Patient verbalized understanding. He will followup in 6 months or sooner if needed.  Ward Givens, MSN, NP-C 07/27/2014, 10:46  AM [facility] [address] [place], [place] [address] [phone]  Note: This document was prepared with digital dictation and possible smart phrase technology. Any transcriptional errors that result from this process are unintentional.

## 2014-07-27 NOTE — Patient Instructions (Signed)
Parkinson Disease Parkinson disease is a disorder of the central nervous system, which includes the brain and spinal cord. A person with this disease slowly loses the ability to completely control body movements. Within the brain, there is a group of nerve cells (basal ganglia) that help control movement. The basal ganglia are damaged and do not work properly in a person with Parkinson disease. In addition, the basal ganglia produce and use a brain chemical called dopamine. The dopamine chemical sends messages to other parts of the body to control and coordinate body movements. Dopamine levels are low in a person with Parkinson disease. If the dopamine levels are low, then the body does not receive the correct messages it needs to move normally.  CAUSES  The exact reason why the basal ganglia get damaged is not known. Some medical researchers have thought that infection, genes, environment, and certain medicines may contribute to the cause.  SYMPTOMS   An early symptom of Parkinson disease is often an uncontrolled shaking (tremor) of the hands. The tremor will often disappear when the affected hand is consciously used.  As the disease progresses, walking, talking, getting out of a chair, and new movements become more difficult.  Muscles get stiff and movements become slower.  Balance and coordination become harder.  Depression, trouble swallowing, urinary problems, constipation, and sleep problems can occur.  Later in the disease, memory and thought processes may deteriorate. DIAGNOSIS  There are no specific tests to diagnose Parkinson disease. You may be referred to a neurologist for evaluation. Your caregiver will ask about your medical history, symptoms, and perform a physical exam. Blood tests and imaging tests of your brain may be performed to rule out other diseases. The imaging tests may include an MRI or a CT scan. TREATMENT  The goal of treatment is to relieve symptoms. Medicines may be  prescribed once the symptoms become troublesome. Medicine will not stop the progression of the disease, but medicine can make movement and balance better and help control tremors. Speech and occupational therapy may also be prescribed. Sometimes, surgical treatment of the brain can be done in young people. HOME CARE INSTRUCTIONS  Get regular exercise and rest periods during the day to help prevent exhaustion and depression.  If getting dressed becomes difficult, replace buttons and zippers with Velcro and elastic on your clothing.  Take all medicine as directed by your caregiver.  Install grab bars or railings in your home to prevent falls.  Go to speech or occupational therapy as directed.  Keep all follow-up visits as directed by your caregiver. SEEK MEDICAL CARE IF:  Your symptoms are not controlled with your medicine.  You fall.  You have trouble swallowing or choke on your food. MAKE SURE YOU:  Understand these instructions.  Will watch your condition.  Will get help right away if you are not doing well or get worse. Document Released: 09/25/2000 Document Revised: 01/23/2013 Document Reviewed: 10/28/2011 Exit[employer] Patient Information 2015 [employer]. This information is not intended to replace advice given to you by your health care provider. Make sure you discuss any questions you have with your health care provider.  

## 2014-09-20 ENCOUNTER — Telehealth: Payer: Self-pay | Admitting: Oncology

## 2014-09-20 ENCOUNTER — Other Ambulatory Visit (HOSPITAL_BASED_OUTPATIENT_CLINIC_OR_DEPARTMENT_OTHER): Payer: 59

## 2014-09-20 ENCOUNTER — Ambulatory Visit (HOSPITAL_BASED_OUTPATIENT_CLINIC_OR_DEPARTMENT_OTHER): Payer: 59 | Admitting: Oncology

## 2014-09-20 VITALS — BP 132/63 | HR 59 | Temp 98.0°F | Resp 17 | Wt 265.9 lb

## 2014-09-20 DIAGNOSIS — D72820 Lymphocytosis (symptomatic): Secondary | ICD-10-CM

## 2014-09-20 DIAGNOSIS — D72829 Elevated white blood cell count, unspecified: Secondary | ICD-10-CM

## 2014-09-20 DIAGNOSIS — C9110 Chronic lymphocytic leukemia of B-cell type not having achieved remission: Secondary | ICD-10-CM

## 2014-09-20 LAB — COMPREHENSIVE METABOLIC PANEL (CC13)
ALK PHOS: 53 U/L (ref 40–150)
ALT: 22 U/L (ref 0–55)
AST: 17 U/L (ref 5–34)
Albumin: 4 g/dL (ref 3.5–5.0)
Anion Gap: 11 mEq/L (ref 3–11)
BILIRUBIN TOTAL: 0.6 mg/dL (ref 0.20–1.20)
BUN: 15.3 mg/dL (ref 7.0–26.0)
CO2: 26 mEq/L (ref 22–29)
Calcium: 9.8 mg/dL (ref 8.4–10.4)
Chloride: 103 mEq/L (ref 98–109)
Creatinine: 1 mg/dL (ref 0.7–1.3)
EGFR: 80 mL/min/{1.73_m2} — AB (ref >90)
Glucose: 124 mg/dl (ref 70–140)
Potassium: 3.8 mEq/L (ref 3.5–5.1)
SODIUM: 141 meq/L (ref 136–145)
TOTAL PROTEIN: 6.4 g/dL (ref 6.4–8.3)

## 2014-09-20 LAB — CBC WITH DIFFERENTIAL/PLATELET
BASO%: 0.9 % (ref 0.0–2.0)
Basophils Absolute: 0.3 10*3/uL — ABNORMAL HIGH (ref 0.0–0.1)
EOS%: 0.7 % (ref 0.0–7.0)
Eosinophils Absolute: 0.2 10*3/uL (ref 0.0–0.5)
HCT: 42.7 % (ref 38.4–49.9)
HGB: 13.9 g/dL (ref 13.0–17.1)
LYMPH%: 81 % — AB (ref 14.0–49.0)
MCH: 28.3 pg (ref 27.2–33.4)
MCHC: 32.6 g/dL (ref 32.0–36.0)
MCV: 86.7 fL (ref 79.3–98.0)
MONO#: 0.8 10*3/uL (ref 0.1–0.9)
MONO%: 2.6 % (ref 0.0–14.0)
NEUT#: 4.6 10*3/uL (ref 1.5–6.5)
NEUT%: 14.8 % — ABNORMAL LOW (ref 39.0–75.0)
PLATELETS: 303 10*3/uL (ref 140–400)
RBC: 4.92 10*6/uL (ref 4.20–5.82)
RDW: 13.7 % (ref 11.0–14.6)
WBC: 30.8 10*3/uL — AB (ref 4.0–10.3)
lymph#: 25 10*3/uL — ABNORMAL HIGH (ref 0.9–3.3)

## 2014-09-20 LAB — TECHNOLOGIST REVIEW

## 2014-09-20 NOTE — Progress Notes (Signed)
Hematology and Oncology Follow Up Visit  Cory [name] [id number] 1950-10-20 63 y.o. 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 His diagnosis confirmed by flow cytometry.  Current therapy: Observation and surveillance.   Interim History: Cory [name] presents for follow up visit with his wife. Since his last visit, he reports no new issues at this time. He does report some fatigue at times but able to work full-time. He had not reported any fevers, did not report any chills, had not reported any weight loss, appetite changes. He does not report any palpable lymphadenopathy. He did not report any headaches or blurry vision or double vision. Did not report any syncope. He did not report any chest pain shortness of breath or difficulty breathing. He does not report any abdominal pain or diarrhea. He does report occasional constipation that is easily managed. Is not reporting any hematuria or dysuria. Did not report any arthralgias and myalgias.  He has not reported any hospitalization or illnesses. Rest of his review of systems unremarkable.   Medications: I have reviewed the patient's current medications. Unchanged by my review. Current Outpatient Prescriptions  Medication Sig Dispense Refill  . aspirin 81 MG tablet Take 81 mg by mouth daily.    Marland Kitchen atorvastatin (LIPITOR) 40 MG tablet Take 40 mg by mouth Daily.    . carvedilol (COREG) 3.125 MG tablet Take 3.125 mg by mouth 2 (two) times daily with a meal.    . glimepiride (AMARYL) 2 MG tablet Take 2 mg by mouth Daily.    Marland Kitchen losartan-hydrochlorothiazide (HYZAAR) 100-25 MG per tablet Take by mouth Daily.    . metFORMIN (GLUCOPHAGE-XR) 500 MG 24 hr tablet Take 500 mg by mouth 2 (two) times daily.     . Multiple Vitamin (MULTIVITAMIN) capsule Take 1 capsule by mouth daily.    . Multiple Vitamins-Minerals (ICAPS MV PO) Take 1 capsule by mouth daily.    . rasagiline (AZILECT) 1 MG  TABS tablet Take 1 tablet (1 mg total) by mouth daily. 90 tablet 3  . Ropinirole HCl 6 MG TB24 Take 3 tablets (18 mg total) by mouth at bedtime. 270 tablet 3  . sildenafil (VIAGRA) 50 MG tablet Take 50 mg by mouth daily as needed.    . tolterodine (DETROL LA) 4 MG 24 hr capsule Take 4 mg by mouth daily.     No current facility-administered medications for this visit.    Allergies:  Allergies  Allergen Reactions  . Accupril [Quinapril Hcl]     Past Medical History, Surgical history, Social history, and Family History were reviewed and updated.   Physical Exam: Blood pressure 132/63, pulse 59, temperature 98 F (36.7 C), resp. rate 17, weight 265 lb 14.4 oz (120.611 kg). ECOG: 0 General appearance: alert and awake not in any distress. Head: Normocephalic, without obvious abnormality, atraumatic Neck: no adenopathy Lymph nodes: Cervical, supraclavicular, and axillary nodes normal. Heart:regular rate and rhythm, S1, S2 normal, no murmur, click, rub or gallop Lung:chest clear, no wheezing, rales, normal symmetric air entry no dullness to percussion. Abdomin: soft, non-tender, without masses or organomegaly no shifting dullness or fluid shift. EXT:no erythema, induration, or nodules Skin: No rashes or lesions.  Lab Results: Lab Results  Component Value Date   WBC 30.8* 09/20/2014   HGB 13.9 09/20/2014   HCT 42.7 09/20/2014   MCV 86.7 09/20/2014   PLT 303 09/20/2014     Chemistry  Component Value Date/Time   NA 141 03/21/2014 0810   NA 138 12/19/2007 2253   K 4.0 03/21/2014 0810   K 3.6 12/19/2007 2253   CL 104 03/21/2013 0818   CL 104 12/19/2007 2253   CO2 28 03/21/2014 0810   BUN 15.9 03/21/2014 0810   BUN 15 12/19/2007 2253   CREATININE 1.1 03/21/2014 0810   CREATININE 1.2 12/19/2007 2253      Component Value Date/Time   CALCIUM 9.6 03/21/2014 0810   ALKPHOS 57 03/21/2014 0810   AST 16 03/21/2014 0810   ALT 22 03/21/2014 0810   BILITOT 0.57 03/21/2014 0810       Impression and Plan:  This is a 63 year old gentleman with:  1. Leukocytosis and mild lymphocytosis. Flow cytometry did show B cell markers consistent with CLL. His laboratory data  reviewed today and his white cell count did not changed dramatically and his lymphocyte percentage slightly increased to 81%. He continues to be asymptomatic without any indication for treatment at this time. I educated him about symptoms associated with CLL including adenopathy, autoimmune cytopenias and infections. As well as constitutional symptoms of fevers, chills and weight loss. Despite his slight rise in his white cell count I see no indications for treatment at this time. We will continue to follow him closely and repeat laboratory testing and physical examination 6 months.  2. Parkinson's: Appears to be controlled at this time.    [facility], MD 12/10/20158:32 AM

## 2014-09-20 NOTE — Telephone Encounter (Signed)
Gave avs & cal for June 2016. °

## 2015-01-07 ENCOUNTER — Other Ambulatory Visit: Payer: Self-pay | Admitting: Nurse Practitioner

## 2015-01-29 ENCOUNTER — Encounter: Payer: Self-pay | Admitting: Neurology

## 2015-01-29 ENCOUNTER — Ambulatory Visit (INDEPENDENT_AMBULATORY_CARE_PROVIDER_SITE_OTHER): Payer: BLUE CROSS/BLUE SHIELD | Admitting: Neurology

## 2015-01-29 VITALS — BP 141/80 | HR 66 | Ht 71.0 in | Wt 257.0 lb

## 2015-01-29 DIAGNOSIS — G20 Parkinson's disease: Secondary | ICD-10-CM

## 2015-01-29 MED ORDER — SELEGILINE HCL 5 MG PO TABS: 5.0000 mg | ORAL_TABLET | Freq: Two times a day (BID) | ORAL | Status: DC

## 2015-01-29 MED ORDER — ROPINIROLE HCL 3 MG PO TABS: ORAL_TABLET | ORAL | Status: DC

## 2015-01-29 NOTE — Progress Notes (Signed)
PATIENT: Cory [name] DOB: [birth year]  REASON FOR VISIT: follow up Cory FROM: patient  Cory OF PRESENT ILLNESS: Cory [name] is a 64 year old [name] with a Cory of parkinsons disease. He returns today for follow-up. He is currently taking requip and azilect and tolerating it well. Gait and balance and remained the same. His wife feels that his walking as improved. He does feel that his balance is off some in the morning. He states that he gets straight out of bed and will have a "whoozy" feeling. Denies any issues with swallowing. Denies drooling. Patient denies having tremor. Patient is having a hard time iniating sleep. He states that he would like to be in bed by 10:00 but often he does not fall asleep till 12:00AM. He does state that he is have some issues with constipation. States that he will go 3-4 days before having a bowel movement. He tries to eat Bran cereal in the morning.  Patient is extremely worried about his memory. He feels that he is going loose his memory and would not even realize it. His wife  Has not noticed any big change in his memory. He continues to operate a motor vehicle. He states that he works in Press [employer] and drives everyday. His wife feels that his reaction time has decreased and she states he changes lanes often. He has not had any accidents while driving.   Cory [name], accompanied by his wife, referred by his primary care physician patterns loss for evaluation of possible Parkinson's disease.  He had past medical Cory of obesity, diabetes, hypertension, hyperlipidemia, obstructive sleep apnea, using CPAP machine, sedentary lifestyle   Since 2013, he developed intermittent mild erectile dysfunction, also noticed loss sense of smell, was considered due to CPAP machine use, wife also noticed that he has excessive movement during his sleep, vivid dreams, acting and talking during dreams, he has occasionally  lightheadedness when getting up from seated position  One year ago, he was noticed to have decreased stride while walking, decreased arm swing, he has fell couple times over the past couple years, but no clear reasons, there was no significant hands tremor  His maternal grandmother suffered Parkinson's disease, he works as a Personal assistant a lot of driving,desk job, sedentary lifestyle  MRI brain (without contrast) demonstrating subtle left frontal subcortical focus (25mm) of gliosis.  He tolerated requip xr 2mg  3 tabs qhs well, no significant side effect, but benefit only lasted few days, no significant improvement noticed now, he has mild gait difficulty, fatigue easily   UPDATE April 19th 2016: he is taking Requip XL are 6 mg 3 tablets every night, Azilect 1 mg every morning, tolerating medication well, but complains of more than USD2000 co-pay for 3 months shipment, he also complains of gradually worsening right hand clumsiness, difficulty buttoning,  Mild early morning lightheadedness, loss sense of smell, chronic constipation, he recently injured his left Achilles tendon, had physical therapy, change of walking posture,   REVIEW OF SYSTEMS: Full 14 system review of systems performed and notable only for leg swelling, constipation, apnea, joint pain, walking difficulty, neck stiffness    ALLERGIES: Allergies  Allergen Reactions  . Accupril [Quinapril Hcl]     HOME MEDICATIONS: Outpatient Prescriptions Prior to Visit  Medication Sig Dispense Refill  . aspirin 81 MG tablet Take 81 mg by mouth daily.    Marland Kitchen atorvastatin (LIPITOR) 40 MG tablet Take 40 mg by mouth Daily.    Marland Kitchen  AZILECT 1 MG TABS tablet TAKE 1 TABLET DAILY 90 tablet 1  . carvedilol (COREG) 3.125 MG tablet Take 3.125 mg by mouth 2 (two) times daily with a meal.    . glimepiride (AMARYL) 2 MG tablet Take 2 mg by mouth Daily.    Marland Kitchen losartan-hydrochlorothiazide (HYZAAR) 100-25 MG per tablet Take by mouth Daily.    .  metFORMIN (GLUCOPHAGE-XR) 500 MG 24 hr tablet Take 500 mg by mouth 2 (two) times daily.     . Multiple Vitamin (MULTIVITAMIN) capsule Take 1 capsule by mouth daily.    . Multiple Vitamins-Minerals (ICAPS MV PO) Take 1 capsule by mouth daily.    . Ropinirole HCl 6 MG TB24 TAKE 3 TABLETS AT BEDTIME 270 tablet 1  . sildenafil (VIAGRA) 50 MG tablet Take 50 mg by mouth daily as needed.    . tolterodine (DETROL LA) 4 MG 24 hr capsule Take 4 mg by mouth daily.     No facility-administered medications prior to visit.    PAST MEDICAL Cory: Past Medical Cory  Diagnosis Date  . Hypertension   . Diabetes   . High cholesterol   . Sleep apnea   . Lymphoplasmacytoid lymphoma, CLL     Pre  . Tendonitis     left achiles tendon    PAST SURGICAL Cory: Past Surgical Cory  Procedure Laterality Date  . Knee surgery Left 2014  . Tonsillectomy  1952    FAMILY Cory: Family Cory  Problem Relation Age of Onset  . Stroke Father   . Heart attack Father   . Sleep apnea Mother   . Cancer Sister     Breast cancer  . Diabetes Sister   . Sleep apnea Brother     SOCIAL Cory: Cory   Social Cory  . Marital Status: Married    Spouse Name: Hilda Blades   . Number of Children: 1  . Years of Education: BS   Occupational Cory  .      Works at [employer] Topics  . Smoking status: Never Smoker   . Smokeless tobacco: Former Systems developer    Types: Snuff    Quit date: 03/22/1989  . Alcohol Use: Yes     Comment: Consumes alcohol on occasion  . Drug Use: No  . Sexual Activity: Not on file   Other Topics Concern  . Not on file   Social Cory Narrative   Pt is here today for his 4 mo check up.Pt is married and does still work at this time.   Married, 1 children   Left handed   BS degree   Drinks caffeine free            PHYSICAL EXAM  Filed Vitals:   01/29/15 1150  BP: 141/80  Pulse: 66  Height: 5\' 11"  (1.803 m)  Weight: 257 lb (116.574 kg)    Body mass index is 35.86 kg/(m^2).  PHYSICAL EXAMNIATION:  Gen: NAD, conversant, well nourised, obese, well groomed                     Cardiovascular: Regular rate rhythm, no peripheral edema, warm, nontender. Eyes: Conjunctivae clear without exudates or hemorrhage Neck: Supple, no carotid bruise. Pulmonary: Clear to auscultation bilaterally   NEUROLOGICAL EXAM:  MENTAL STATUS: Speech:    Speech is normal; fluent and spontaneous with normal comprehension.  Cognition:    The patient is oriented to person, place, and time;     recent and remote  memory intact;     language fluent;     normal attention, concentration,     fund of knowledge.  CRANIAL NERVES: CN II: Visual fields are full to confrontation. Fundoscopic exam is normal with sharp discs and no vascular changes. Venous pulsations are present bilaterally. Pupils are 4 mm and briskly reactive to light. Visual acuity is 20/20 bilaterally. CN III, IV, VI: extraocular movement are normal. No ptosis. CN V: Facial sensation is intact to pinprick in all 3 divisions bilaterally. Corneal responses are intact.  CN VII: Face is symmetric with normal eye closure and smile. CN VIII: Hearing is normal to rubbing fingers CN IX, X: Palate elevates symmetrically. Phonation is normal. CN XI: Head turning and shoulder shrug are intact CN XII: Tongue is midline with normal movements and no atrophy.  MOTOR: Mild right more than left limb and nuchal rigidity, no weakness, normal bulk   Shoulder abduction Shoulder external rotation Elbow flexion Elbow extension Wrist flexion Wrist extension Finger abduction Hip flexion Knee flexion Knee extension Ankle dorsi flexion Ankle plantar flexion  R 5 5 5 5 5 5 5 5 5 5 5 5   L 5 5 5 5 5 5 5 5 5 5 5 5     REFLEXES: Reflexes are 2+ and symmetric at the biceps, triceps, knees, and ankles. Plantar responses are flexor.  SENSORY: Light touch, pinprick, position sense, and vibration sense are intact in  fingers and toes.  COORDINATION: Rapid alternating movements and fine finger movements are intact. There is no dysmetria on finger-to-nose and heel-knee-shin. There are no abnormal or extraneous movements.   GAIT/STANCE: Need to push up from seated position, dragging left leg.   DIAGNOSTIC DATA (LABS, IMAGING, TESTING) - I reviewed patient records, labs, notes, testing and imaging myself where available.  Lab Results  Component Value Date   WBC 30.8* 09/20/2014   HGB 13.9 09/20/2014   HCT 42.7 09/20/2014   MCV 86.7 09/20/2014   PLT 303 09/20/2014      Component Value Date/Time   NA 141 09/20/2014 0750   NA 138 12/19/2007 2253   K 3.8 09/20/2014 0750   K 3.6 12/19/2007 2253   CL 104 03/21/2013 0818   CL 104 12/19/2007 2253   CO2 26 09/20/2014 0750   GLUCOSE 124 09/20/2014 0750   GLUCOSE 205* 03/21/2013 0818   GLUCOSE 172* 12/19/2007 2253   BUN 15.3 09/20/2014 0750   BUN 15 12/19/2007 2253   CREATININE 1.0 09/20/2014 0750   CREATININE 1.2 12/19/2007 2253   CALCIUM 9.8 09/20/2014 0750   PROT 6.4 09/20/2014 0750   ALBUMIN 4.0 09/20/2014 0750   AST 17 09/20/2014 0750   ALT 22 09/20/2014 0750   ALKPHOS 53 09/20/2014 0750   BILITOT 0.60 09/20/2014 0750   Lab Results  Component Value Date   CHOL  12/20/2007    121        ATP III CLASSIFICATION:  <200     mg/dL   Desirable  200-239  mg/dL   Borderline High  >=240    mg/dL   High   HDL 39* 12/20/2007   LDLCALC  12/20/2007    69        Total Cholesterol/HDL:CHD Risk Coronary Heart Disease Risk Table                     Men   Women  1/2 Average Risk   3.4   3.3   TRIG 65 12/20/2007   CHOLHDL 3.1 12/20/2007  ASSESSMENT AND PLAN 64 y.o. year old [name]  has a past medical Cory of Hypertension; Diabetes; High cholesterol; Sleep apnea; Lymphoplasmacytoid lymphoma, CLL; and Tendonitis.    Parkinson's disease  1. Change to regular requip 3mg  2 tab tid. Selegiline 5mg  bid 2. RTC in 3 months  Marcial Pacas, M.D.  Ph.D.  [facility] [place], [place] [address] Phone: [phone] Fax:      [phone]

## 2015-03-21 ENCOUNTER — Ambulatory Visit (HOSPITAL_BASED_OUTPATIENT_CLINIC_OR_DEPARTMENT_OTHER): Payer: BLUE CROSS/BLUE SHIELD | Admitting: Oncology

## 2015-03-21 ENCOUNTER — Other Ambulatory Visit (HOSPITAL_BASED_OUTPATIENT_CLINIC_OR_DEPARTMENT_OTHER): Payer: BLUE CROSS/BLUE SHIELD

## 2015-03-21 ENCOUNTER — Telehealth: Payer: Self-pay | Admitting: Oncology

## 2015-03-21 VITALS — BP 140/61 | HR 63 | Temp 98.0°F | Resp 17 | Ht 71.0 in | Wt 256.6 lb

## 2015-03-21 DIAGNOSIS — D72829 Elevated white blood cell count, unspecified: Secondary | ICD-10-CM

## 2015-03-21 DIAGNOSIS — C9110 Chronic lymphocytic leukemia of B-cell type not having achieved remission: Secondary | ICD-10-CM

## 2015-03-21 DIAGNOSIS — G20 Parkinson's disease: Secondary | ICD-10-CM

## 2015-03-21 LAB — COMPREHENSIVE METABOLIC PANEL (CC13)
ALBUMIN: 3.9 g/dL (ref 3.5–5.0)
ALK PHOS: 54 U/L (ref 40–150)
ALT: 16 U/L (ref 0–55)
AST: 12 U/L (ref 5–34)
Anion Gap: 8 mEq/L (ref 3–11)
BUN: 18 mg/dL (ref 7.0–26.0)
CO2: 26 mEq/L (ref 22–29)
Calcium: 9.6 mg/dL (ref 8.4–10.4)
Chloride: 102 mEq/L (ref 98–109)
Creatinine: 1 mg/dL (ref 0.7–1.3)
EGFR: 82 mL/min/{1.73_m2} — ABNORMAL LOW (ref >90)
GLUCOSE: 240 mg/dL — AB (ref 70–140)
POTASSIUM: 3.8 meq/L (ref 3.5–5.1)
SODIUM: 136 meq/L (ref 136–145)
Total Bilirubin: 0.66 mg/dL (ref 0.20–1.20)
Total Protein: 6.3 g/dL — ABNORMAL LOW (ref 6.4–8.3)

## 2015-03-21 LAB — CBC WITH DIFFERENTIAL/PLATELET
BASO%: 1.1 % (ref 0.0–2.0)
Basophils Absolute: 0.5 10*3/uL — ABNORMAL HIGH (ref 0.0–0.1)
EOS%: 0.3 % (ref 0.0–7.0)
Eosinophils Absolute: 0.2 10*3/uL (ref 0.0–0.5)
HCT: 43.3 % (ref 38.4–49.9)
HEMOGLOBIN: 14.2 g/dL (ref 13.0–17.1)
LYMPH%: 82.2 % — ABNORMAL HIGH (ref 14.0–49.0)
MCH: 28.7 pg (ref 27.2–33.4)
MCHC: 32.7 g/dL (ref 32.0–36.0)
MCV: 87.9 fL (ref 79.3–98.0)
MONO#: 1.4 10*3/uL — AB (ref 0.1–0.9)
MONO%: 3.1 % (ref 0.0–14.0)
NEUT%: 13.3 % — AB (ref 39.0–75.0)
NEUTROS ABS: 5.8 10*3/uL (ref 1.5–6.5)
Platelets: 304 10*3/uL (ref 140–400)
RBC: 4.93 10*6/uL (ref 4.20–5.82)
RDW: 14.1 % (ref 11.0–14.6)
WBC: 43.8 10*3/uL — AB (ref 4.0–10.3)
lymph#: 36 10*3/uL — ABNORMAL HIGH (ref 0.9–3.3)

## 2015-03-21 LAB — TECHNOLOGIST REVIEW

## 2015-03-21 NOTE — Progress Notes (Signed)
Hematology and Oncology Follow Up Visit  Cory [name] [id number] 06-Apr-1951 64 y.o. 03/21/2015 9:18 AM   Principle Diagnosis: 64 year old with early stage CLL diagnosed in 2013. His diagnosis confirmed by flow cytometry.   Current therapy: Observation and surveillance.   Interim History: [name] presents for follow up visit with his wife. Since his last visit, he developed Achilles tendon tendinitis and currently in a walking boot. He did not report any trauma or falls. He does not report any constitutional symptoms however. He does report some fatigue at times but able to work full-time. He had not reported any fevers, did not report any chills, had not reported any weight loss, appetite changes. He does not report any palpable lymphadenopathy. He did not report any headaches or blurry vision or double vision. Did not report any syncope. He did not report any chest pain shortness of breath or difficulty breathing. He does not report any abdominal pain or diarrhea. He does report occasional constipation that is easily managed. Is not reporting any hematuria or dysuria. Did not report any arthralgias and myalgias.  He has not reported any hospitalization or illnesses. Rest of his review of systems unremarkable.   Medications: I have reviewed the patient's current medications. Unchanged by my review. Current Outpatient Prescriptions  Medication Sig Dispense Refill  . aspirin 81 MG tablet Take 81 mg by mouth daily.    Marland Kitchen atorvastatin (LIPITOR) 40 MG tablet Take 40 mg by mouth Daily.    . AZILECT 1 MG TABS tablet TAKE 1 TABLET DAILY 90 tablet 1  . carvedilol (COREG) 3.125 MG tablet Take 3.125 mg by mouth 2 (two) times daily with a meal.    . glimepiride (AMARYL) 2 MG tablet Take 2 mg by mouth Daily.    Marland Kitchen losartan-hydrochlorothiazide (HYZAAR) 100-25 MG per tablet Take by mouth Daily.    . metFORMIN (GLUCOPHAGE-XR) 500 MG 24 hr tablet Take 500 mg by mouth 2 (two) times daily.     . Multiple  Vitamin (MULTIVITAMIN) capsule Take 1 capsule by mouth daily.    . Multiple Vitamins-Minerals (ICAPS MV PO) Take 1 capsule by mouth daily.    Marland Kitchen rOPINIRole (REQUIP) 3 MG tablet One po tid xone week, then 2 tabs po tid 540 tablet 3  . Ropinirole HCl 6 MG TB24 TAKE 3 TABLETS AT BEDTIME 270 tablet 1  . selegiline (ELDEPRYL) 5 MG tablet Take 1 tablet (5 mg total) by mouth 2 (two) times daily with a meal. 180 tablet 3  . sildenafil (VIAGRA) 50 MG tablet Take 50 mg by mouth daily as needed.    . tolterodine (DETROL LA) 4 MG 24 hr capsule Take 4 mg by mouth daily.     No current facility-administered medications for this visit.    Allergies:  Allergies  Allergen Reactions  . Accupril [Quinapril Hcl]     Past Medical History, Surgical history, Social history, and Family History were reviewed and updated.   Physical Exam: Blood pressure 140/61, pulse 63, temperature 98 F (36.7 C), temperature source Oral, resp. rate 17, height 5\' 11"  (1.803 m), weight 256 lb 9.6 oz (116.393 kg), SpO2 98 %. ECOG: 0 General appearance: alert and awake not in any distress. Head: Normocephalic, without obvious abnormality, atraumatic Neck: no adenopathy Lymph nodes: Cervical, supraclavicular, and axillary nodes normal. Heart:regular rate and rhythm, S1, S2 normal, no murmur, click, rub or gallop Lung:chest clear, no wheezing, rales, normal symmetric air entry no dullness to percussion. Abdomin: soft, non-tender, without masses  or organomegaly no shifting dullness or fluid shift. EXT:no erythema, induration, or nodules Skin: No rashes or lesions.  Lab Results: Lab Results  Component Value Date   WBC 43.8* 03/21/2015   HGB 14.2 03/21/2015   HCT 43.3 03/21/2015   MCV 87.9 03/21/2015   PLT 304 03/21/2015     Chemistry      Component Value Date/Time   NA 141 09/20/2014 0750   NA 138 12/19/2007 2253   K 3.8 09/20/2014 0750   K 3.6 12/19/2007 2253   CL 104 03/21/2013 0818   CL 104 12/19/2007 2253    CO2 26 09/20/2014 0750   BUN 15.3 09/20/2014 0750   BUN 15 12/19/2007 2253   CREATININE 1.0 09/20/2014 0750   CREATININE 1.2 12/19/2007 2253      Component Value Date/Time   CALCIUM 9.8 09/20/2014 0750   ALKPHOS 53 09/20/2014 0750   AST 17 09/20/2014 0750   ALT 22 09/20/2014 0750   BILITOT 0.60 09/20/2014 0750      Impression and Plan:  This is a 64 year old gentleman with:  1. Leukocytosis and mild lymphocytosis. Flow cytometry did show B cell markers consistent with CLL.  Laboratory data were reviewed today including slight increase in his white cell count. He continues to be asymptomatic without any indication for treatment at this time. I educated him about symptoms associated with CLL including adenopathy, autoimmune cytopenias and infections. As well as constitutional symptoms of fevers, chills and weight loss.  The bone is to continue with active surveillance and intervene if he becomes symptomatic from his disease.  2. Parkinson's: Appears to be controlled at this time.    [facility], MD 6/9/20169:18 AM

## 2015-03-21 NOTE — Telephone Encounter (Signed)
Lft msg for pt confirming labs/ov per 06/09 POF, mailed AVS and Calendar to pt... KJ

## 2015-04-08 ENCOUNTER — Encounter: Payer: Self-pay | Admitting: Neurology

## 2015-04-08 ENCOUNTER — Other Ambulatory Visit: Payer: Self-pay

## 2015-05-08 ENCOUNTER — Ambulatory Visit: Payer: BLUE CROSS/BLUE SHIELD | Admitting: Neurology

## 2015-05-27 ENCOUNTER — Ambulatory Visit (INDEPENDENT_AMBULATORY_CARE_PROVIDER_SITE_OTHER): Payer: BLUE CROSS/BLUE SHIELD | Admitting: Neurology

## 2015-05-27 ENCOUNTER — Encounter: Payer: Self-pay | Admitting: Neurology

## 2015-05-27 VITALS — BP 134/74 | HR 72 | Ht 71.0 in | Wt 260.0 lb

## 2015-05-27 DIAGNOSIS — G20 Parkinson's disease: Secondary | ICD-10-CM | POA: Diagnosis not present

## 2015-05-27 MED ORDER — ROPINIROLE HCL ER 6 MG PO TB24: 3.0000 | ORAL_TABLET | Freq: Every day | ORAL | Status: DC

## 2015-05-27 MED ORDER — RASAGILINE MESYLATE 1 MG PO TABS: 1.0000 mg | ORAL_TABLET | Freq: Every day | ORAL | Status: DC

## 2015-05-27 NOTE — Progress Notes (Signed)
Chief Complaint  Patient presents with  . Paralysis Agitans    He is still concerned about the decreased dexterity in his right hand. No concerns with his memory.  He is in physical therapy for his left achilles tendon injury.      PATIENT: Cory [name] DOB: 07/20/1951  REASON FOR VISIT: follow up HISTORY FROM: patient  HISTORY OF PRESENT ILLNESS: HISTORY (YY): 64 years old right-handed Caucasian male, accompanied by his wife, referred by his primary care physician patterns loss for evaluation of possible Parkinson's disease.  He had past medical history of obesity, diabetes, hypertension, hyperlipidemia, obstructive sleep apnea, using CPAP machine, sedentary lifestyle   Since 2013, he developed intermittent mild erectile dysfunction, also noticed loss sense of smell, was considered due to CPAP machine use, wife also noticed that he has excessive movement during his sleep, vivid dreams, acting and talking during dreams, he has occasionally lightheadedness when getting up from seated position  One year ago, he was noticed to have decreased stride while walking, decreased arm swing, he has fell couple times over the past couple years, but no clear reasons, there was no significant hands tremor  His maternal grandmother suffered Parkinson's disease, he works as a Personal assistant a lot of driving,desk job, sedentary lifestyle  MRI brain (without contrast) demonstrating subtle left frontal subcortical focus (27mm) of gliosis.  He tolerated requip xr 2mg  3 tabs qhs well, no significant side effect, but benefit only lasted few days, no significant improvement noticed now, he has mild gait difficulty, fatigue easily   UPDATE April 19th 2016: he is taking Requip XL are 6 mg 3 tablets every night, Azilect 1 mg every morning, tolerating medication well, but complains of more than USD2000 co-pay for 3 months shipment, he also complains of gradually worsening right hand clumsiness, difficulty  buttoning,  Mild early morning lightheadedness, loss sense of smell, chronic constipation, he recently injured his left Achilles tendon, had physical therapy, change of walking posture,  UPDATE August 15th 2016: He has left Achilles tendon injury, is in a lot of pain, limited exercise,  He continued to work full-time, driving average 20 hours each week, complains of excessive daytime sleepiness sometimes, he is now taking requip xr 6 mg 3 tablets all at once, tends to get up very early in the morning,  He denies significant memory trouble   REVIEW OF SYSTEMS: Full 14 system review of systems performed and notable only for excessive sweating, leg swelling, restless leg, apnea, acting out of dreams, joint pain, walking difficulties.   ALLERGIES: Allergies  Allergen Reactions  . Accupril [Quinapril Hcl]     HOME MEDICATIONS: Outpatient Prescriptions Prior to Visit  Medication Sig Dispense Refill  . aspirin 81 MG tablet Take 81 mg by mouth daily.    Marland Kitchen atorvastatin (LIPITOR) 40 MG tablet Take 40 mg by mouth Daily.    . AZILECT 1 MG TABS tablet TAKE 1 TABLET DAILY 90 tablet 1  . carvedilol (COREG) 3.125 MG tablet Take 3.125 mg by mouth 2 (two) times daily with a meal.    . glimepiride (AMARYL) 2 MG tablet Take 2 mg by mouth Daily.    Marland Kitchen losartan-hydrochlorothiazide (HYZAAR) 100-25 MG per tablet Take by mouth Daily.    . metFORMIN (GLUCOPHAGE-XR) 500 MG 24 hr tablet Take 500 mg by mouth 2 (two) times daily.     . Multiple Vitamin (MULTIVITAMIN) capsule Take 1 capsule by mouth daily.    . Multiple Vitamins-Minerals (ICAPS MV PO) Take 1 capsule by  mouth daily.    Marland Kitchen rOPINIRole (REQUIP) 3 MG tablet One po tid xone week, then 2 tabs po tid 540 tablet 3  . Ropinirole HCl 6 MG TB24 TAKE 3 TABLETS AT BEDTIME 270 tablet 1  . selegiline (ELDEPRYL) 5 MG tablet Take 1 tablet (5 mg total) by mouth 2 (two) times daily with a meal. 180 tablet 3  . sildenafil (VIAGRA) 50 MG tablet Take 50 mg by mouth  daily as needed.    . tolterodine (DETROL LA) 4 MG 24 hr capsule Take 4 mg by mouth daily.     No facility-administered medications prior to visit.    PAST MEDICAL HISTORY: Past Medical History  Diagnosis Date  . Hypertension   . Diabetes   . High cholesterol   . Sleep apnea   . Lymphoplasmacytoid lymphoma, CLL     Pre  . Tendonitis     left achiles tendon    PAST SURGICAL HISTORY: Past Surgical History  Procedure Laterality Date  . Knee surgery Left 2014  . Tonsillectomy  1952    FAMILY HISTORY: Family History  Problem Relation Age of Onset  . Stroke Father   . Heart attack Father   . Sleep apnea Mother   . Cancer Sister     Breast cancer  . Diabetes Sister   . Sleep apnea Brother     SOCIAL HISTORY: Social History   Social History  . Marital Status: Married    Spouse Name: Hilda Blades   . Number of Children: 1  . Years of Education: BS   Occupational History  .      Works at [employer] Topics  . Smoking status: Never Smoker   . Smokeless tobacco: Former Systems developer    Types: Snuff    Quit date: 03/22/1989  . Alcohol Use: Yes     Comment: Consumes alcohol on occasion  . Drug Use: No  . Sexual Activity: Not on file   Other Topics Concern  . Not on file   Social History Narrative   Pt is here today for his 4 mo check up.Pt is married and does still work at this time.   Married, 1 children   Left handed   BS degree   Drinks caffeine free            PHYSICAL EXAM  Filed Vitals:   05/27/15 1420  BP: 134/74  Pulse: 72  Height: 5\' 11"  (1.803 m)  Weight: 260 lb (117.935 kg)   Body mass index is 36.28 kg/(m^2).  PHYSICAL EXAMNIATION:  Gen: NAD, conversant, well nourised, obese, well groomed                     Cardiovascular: Regular rate rhythm, no peripheral edema, warm, nontender. Eyes: Conjunctivae clear without exudates or hemorrhage Neck: Supple, no carotid bruise. Pulmonary: Clear to auscultation bilaterally    NEUROLOGICAL EXAM:  MENTAL STATUS: Speech:    Speech is normal; fluent and spontaneous with normal comprehension.  Cognition:    The patient is oriented to person, place, and time;     recent and remote memory intact;     language fluent;     normal attention, concentration,     fund of knowledge.  CRANIAL NERVES: CN II: Visual fields are full to confrontation. Pupils are equal round reactive to light. CN III, IV, VI: extraocular movement are normal. No ptosis. CN V: Facial sensation is intact to pinprick in all 3 divisions  bilaterally. Corneal responses are intact.  CN VII: Face is symmetric with normal eye closure and smile. CN VIII: Hearing is normal to rubbing fingers CN IX, X: Palate elevates symmetrically. Phonation is normal. CN XI: Head turning and shoulder shrug are intact CN XII: Tongue is midline with normal movements and no atrophy.  MOTOR: Mild right more than left limb and nuchal rigidity, no weakness, normal bulk, early fatigability with rapid wrist opening and closure   REFLEXES: Reflexes are 2+ and symmetric at the biceps, triceps, knees, and ankles. Plantar responses are flexor.  SENSORY: Light touch, pinprick, position sense, and vibration sense are intact in fingers and toes.  COORDINATION: Rapid alternating movements and fine finger movements are intact. There is no dysmetria on finger-to-nose and heel-knee-shin.   GAIT/STANCE: Need to push up from seated position, dragging left leg.   DIAGNOSTIC DATA (LABS, IMAGING, TESTING) - I reviewed patient records, labs, notes, testing and imaging myself where available.  Lab Results  Component Value Date   WBC 43.8* 03/21/2015   HGB 14.2 03/21/2015   HCT 43.3 03/21/2015   MCV 87.9 03/21/2015   PLT 304 03/21/2015      Component Value Date/Time   NA 136 03/21/2015 0836   NA 138 12/19/2007 2253   K 3.8 03/21/2015 0836   K 3.6 12/19/2007 2253   CL 104 03/21/2013 0818   CL 104 12/19/2007 2253   CO2  26 03/21/2015 0836   GLUCOSE 240* 03/21/2015 0836   GLUCOSE 205* 03/21/2013 0818   GLUCOSE 172* 12/19/2007 2253   BUN 18.0 03/21/2015 0836   BUN 15 12/19/2007 2253   CREATININE 1.0 03/21/2015 0836   CREATININE 1.2 12/19/2007 2253   CALCIUM 9.6 03/21/2015 0836   PROT 6.3* 03/21/2015 0836   ALBUMIN 3.9 03/21/2015 0836   AST 12 03/21/2015 0836   ALT 16 03/21/2015 0836   ALKPHOS 54 03/21/2015 0836   BILITOT 0.66 03/21/2015 0836   Lab Results  Component Value Date   CHOL  12/20/2007    121        ATP III CLASSIFICATION:  <200     mg/dL   Desirable  200-239  mg/dL   Borderline High  >=240    mg/dL   High   HDL 39* 12/20/2007   LDLCALC  12/20/2007    69        Total Cholesterol/HDL:CHD Risk Coronary Heart Disease Risk Table                     Men   Women  1/2 Average Risk   3.4   3.3   TRIG 65 12/20/2007   CHOLHDL 3.1 12/20/2007   ASSESSMENT AND PLAN 65 y.o. year old male  has a past medical history of Hypertension; Diabetes; High cholesterol; Sleep apnea; Lymphoplasmacytoid lymphoma, CLL; and Tendonitis.    Idiopathic Parkinson's disease  Keep requip xr 6mg  3 tabs po qhs Azilect 1mg  qday    Marcial Pacas, M.D. Ph.D.  [facility] [place], [place] [address] Phone: [phone] Fax:      [phone]

## 2015-08-28 ENCOUNTER — Other Ambulatory Visit: Payer: Self-pay | Admitting: Dermatology

## 2015-09-19 ENCOUNTER — Ambulatory Visit (HOSPITAL_BASED_OUTPATIENT_CLINIC_OR_DEPARTMENT_OTHER): Payer: BLUE CROSS/BLUE SHIELD | Admitting: Oncology

## 2015-09-19 ENCOUNTER — Other Ambulatory Visit (HOSPITAL_BASED_OUTPATIENT_CLINIC_OR_DEPARTMENT_OTHER): Payer: BLUE CROSS/BLUE SHIELD

## 2015-09-19 ENCOUNTER — Telehealth: Payer: Self-pay | Admitting: Oncology

## 2015-09-19 VITALS — BP 136/65 | HR 67 | Temp 98.0°F | Resp 19 | Ht 71.0 in | Wt 261.8 lb

## 2015-09-19 DIAGNOSIS — C9110 Chronic lymphocytic leukemia of B-cell type not having achieved remission: Secondary | ICD-10-CM | POA: Diagnosis not present

## 2015-09-19 DIAGNOSIS — G20 Parkinson's disease: Secondary | ICD-10-CM

## 2015-09-19 DIAGNOSIS — D72829 Elevated white blood cell count, unspecified: Secondary | ICD-10-CM

## 2015-09-19 DIAGNOSIS — D72820 Lymphocytosis (symptomatic): Secondary | ICD-10-CM

## 2015-09-19 LAB — COMPREHENSIVE METABOLIC PANEL
ALT: 17 U/L (ref 0–55)
ANION GAP: 13 meq/L — AB (ref 3–11)
AST: 18 U/L (ref 5–34)
Albumin: 4.1 g/dL (ref 3.5–5.0)
Alkaline Phosphatase: 57 U/L (ref 40–150)
BILIRUBIN TOTAL: 0.49 mg/dL (ref 0.20–1.20)
BUN: 19.7 mg/dL (ref 7.0–26.0)
CHLORIDE: 105 meq/L (ref 98–109)
CO2: 22 meq/L (ref 22–29)
Calcium: 9.8 mg/dL (ref 8.4–10.4)
Creatinine: 1.1 mg/dL (ref 0.7–1.3)
EGFR: 72 mL/min/{1.73_m2} — AB (ref >90)
Glucose: 147 mg/dl — ABNORMAL HIGH (ref 70–140)
Potassium: 4 mEq/L (ref 3.5–5.1)
Sodium: 140 mEq/L (ref 136–145)
TOTAL PROTEIN: 6.7 g/dL (ref 6.4–8.3)

## 2015-09-19 LAB — CBC WITH DIFFERENTIAL/PLATELET
BASO%: 0.3 % (ref 0.0–2.0)
BASOS ABS: 0.1 10*3/uL (ref 0.0–0.1)
EOS ABS: 0.3 10*3/uL (ref 0.0–0.5)
EOS%: 0.7 % (ref 0.0–7.0)
HCT: 42 % (ref 38.4–49.9)
HGB: 13.8 g/dL (ref 13.0–17.1)
LYMPH%: 87.2 % — AB (ref 14.0–49.0)
MCH: 28.6 pg (ref 27.2–33.4)
MCHC: 32.8 g/dL (ref 32.0–36.0)
MCV: 87.1 fL (ref 79.3–98.0)
MONO#: 0.3 10*3/uL (ref 0.1–0.9)
MONO%: 0.6 % (ref 0.0–14.0)
NEUT#: 4.7 10*3/uL (ref 1.5–6.5)
NEUT%: 11.2 % — AB (ref 39.0–75.0)
Platelets: 314 10*3/uL (ref 140–400)
RBC: 4.82 10*6/uL (ref 4.20–5.82)
RDW: 14 % (ref 11.0–14.6)
WBC: 41.8 10*3/uL — ABNORMAL HIGH (ref 4.0–10.3)
lymph#: 36.4 10*3/uL — ABNORMAL HIGH (ref 0.9–3.3)

## 2015-09-19 LAB — LACTATE DEHYDROGENASE: LDH: 250 U/L — AB (ref 125–245)

## 2015-09-19 LAB — TECHNOLOGIST REVIEW

## 2015-09-19 NOTE — Telephone Encounter (Signed)
Gave patient avs report and appointments for June 2017. °

## 2015-09-19 NOTE — Addendum Note (Signed)
Addended by: Randolm Idol on: 09/19/2015 09:31 AM   Modules accepted: Orders, Medications

## 2015-09-19 NOTE — Progress Notes (Signed)
Hematology and Oncology Follow Up Visit  Cory [name] AO:[id number] 05-28-1951 64 y.o. 09/19/2015 8:32 AM   Principle Diagnosis: 64 year old with early stage CLL diagnosed in 2013. His diagnosis confirmed by flow cytometry. He presented there with leukocytosis without lymphadenopathy indicating stage 0 disease.  Current therapy: Observation and surveillance.   Interim History: Cory [name] presents for follow up visit with his wife. Since his last visit, he reports no recent complaints and relatively doing well. He continues to deal with  Achilles tendon tendinitis and currently been treated with anti-inflammatory medication and no surgery. He is off the walking boot since the last visit.   He does not report any constitutional symptoms however. He does report some fatigue at times but able to work full-time. He had not reported any fevers, did not report any chills, had not reported any weight loss, appetite changes. He does not report any palpable lymphadenopathy. His appetite and performance status remains excellent.  He did not report any headaches or blurry vision or double vision. Did not report any syncope. He did not report any chest pain shortness of breath or difficulty breathing. He does not report any abdominal pain or diarrhea. He does report occasional constipation that is easily managed. Is not reporting any hematuria or dysuria. Did not report any arthralgias and myalgias.  He has not reported any hospitalization or illnesses. Rest of his review of systems unremarkable.   Medications: I have reviewed the patient's current medications. Unchanged by my review. Current Outpatient Prescriptions  Medication Sig Dispense Refill  . aspirin 81 MG tablet Take 81 mg by mouth daily.    Marland Kitchen atorvastatin (LIPITOR) 40 MG tablet Take 40 mg by mouth Daily.    . carvedilol (COREG) 3.125 MG tablet Take 3.125 mg by mouth 2 (two) times daily with a meal.    . glimepiride (AMARYL) 2 MG tablet Take 2 mg  by mouth Daily.    Marland Kitchen losartan-hydrochlorothiazide (HYZAAR) 100-25 MG per tablet Take by mouth Daily.    . metFORMIN (GLUCOPHAGE-XR) 500 MG 24 hr tablet Take 500 mg by mouth 2 (two) times daily.     . Multiple Vitamin (MULTIVITAMIN) capsule Take 1 capsule by mouth daily.    . Multiple Vitamins-Minerals (ICAPS MV PO) Take 1 capsule by mouth daily.    . nitroGLYCERIN (NITRODUR - DOSED IN MG/24 HR) 0.2 mg/hr patch As directed for left heel  3  . rasagiline (AZILECT) 1 MG TABS tablet Take 1 tablet (1 mg total) by mouth daily. 90 tablet 3  . Ropinirole HCl 6 MG TB24 Take 3 tablets (18 mg total) by mouth at bedtime. 270 tablet 3  . sildenafil (VIAGRA) 50 MG tablet Take 50 mg by mouth daily as needed.    . tolterodine (DETROL LA) 4 MG 24 hr capsule Take 4 mg by mouth daily.     No current facility-administered medications for this visit.    Allergies:  Allergies  Allergen Reactions  . Accupril [Quinapril Hcl]     Past Medical History, Surgical history, Social history, and Family History were reviewed and updated.   Physical Exam: Blood pressure 136/65, pulse 67, temperature 98 F (36.7 C), temperature source Oral, resp. rate 19, height 5\' 11"  (1.803 m), weight 261 lb 12.8 oz (118.752 kg), SpO2 96 %. ECOG: 0 General appearance: alert gentleman did not appear any distress. Head: Normocephalic, without obvious abnormality, atraumatic no oral ulcers or lesions. Neck: no adenopathy no thyromegaly. Lymph nodes: Cervical, supraclavicular, and axillary nodes normal.  Heart:regular rate and rhythm, S1, S2 normal, no murmur, click, rub or gallop Lung:chest clear, no wheezing, rales. No dullness to percussion. Abdomin: soft, non-tender, without masses or organomegaly no rebound or guarding. EXT:no erythema, induration, or nodules Skin: No rashes or lesions.  Lab Results: Lab Results  Component Value Date   WBC 41.8* 09/19/2015   HGB 13.8 09/19/2015   HCT 42.0 09/19/2015   MCV 87.1 09/19/2015    PLT 314 09/19/2015     Chemistry      Component Value Date/Time   NA 136 03/21/2015 0836   NA 138 12/19/2007 2253   K 3.8 03/21/2015 0836   K 3.6 12/19/2007 2253   CL 104 03/21/2013 0818   CL 104 12/19/2007 2253   CO2 26 03/21/2015 0836   BUN 18.0 03/21/2015 0836   BUN 15 12/19/2007 2253   CREATININE 1.0 03/21/2015 0836   CREATININE 1.2 12/19/2007 2253      Component Value Date/Time   CALCIUM 9.6 03/21/2015 0836   ALKPHOS 54 03/21/2015 0836   AST 12 03/21/2015 0836   ALT 16 03/21/2015 0836   BILITOT 0.66 03/21/2015 0836      Impression and Plan:  This is a 64 year old gentleman with:  1. Leukocytosis and mild lymphocytosis. Flow cytometry did show B cell markers consistent with CLL.  His white cell count reviewed from today and showed slight increase in the last year or so. His white cell count doubling time is approaching 24 months and he is completely asymptomatic. I continue to educate him about signs and symptoms of CLL progression. The symptoms include fevers, chills, palpable painful adenopathy or unexplained weight loss. These are all indication to start treatment for CLL and he has none of them. He does not have any other cytopenias to warrant treatment.  The plan is to continue with active surveillance and repeat laboratory testing in 6 months. We will obtain imaging studies if he develops any symptoms.  2. Parkinson's disease: Seems to be manageable at this time.  3. Follow-up: Will be in 6 months.    [facility], MD 12/8/20168:32 AM

## 2015-11-25 ENCOUNTER — Ambulatory Visit (INDEPENDENT_AMBULATORY_CARE_PROVIDER_SITE_OTHER): Payer: BLUE CROSS/BLUE SHIELD | Admitting: Neurology

## 2015-11-25 ENCOUNTER — Encounter: Payer: Self-pay | Admitting: Neurology

## 2015-11-25 DIAGNOSIS — G20 Parkinson's disease: Secondary | ICD-10-CM | POA: Diagnosis not present

## 2015-11-25 MED ORDER — ROPINIROLE HCL ER 6 MG PO TB24: 3.0000 | ORAL_TABLET | Freq: Every day | ORAL | Status: AC

## 2015-11-25 MED ORDER — RASAGILINE MESYLATE 1 MG PO TABS: 1.0000 mg | ORAL_TABLET | Freq: Every day | ORAL | Status: AC

## 2015-11-25 NOTE — Progress Notes (Signed)
Chief Complaint  Patient presents with  . Parkinson's Disease    He is here with his wife, Hilda Blades.  He continues to have problems using his right hand and feels he is moving much slower.  His wife estimates it taking him a hour to get dressed in the mornings.  . Hip Pain    He developed right hip pain over one week ago.  This started after he climbed a twelve foot ladder leading to his attic.  He initially thought he pulled a muscle but the pain is getting worse.      PATIENT: Cory [name] DOB: 09-Dec-1950  REASON FOR VISIT: follow up HISTORY FROM: patient  HISTORY OF PRESENT ILLNESS: HISTORY (YY): 65 years old right-handed Caucasian male, accompanied by his wife, referred by his primary care physician patterns loss for evaluation of possible Parkinson's disease.  He had past medical history of obesity, diabetes, hypertension, hyperlipidemia, obstructive sleep apnea, using CPAP machine, sedentary lifestyle   Since 2013, he developed intermittent mild erectile dysfunction, also noticed loss sense of smell, was considered due to CPAP machine use, wife also noticed that he has excessive movement during his sleep, vivid dreams, acting and talking during dreams, he has occasionally lightheadedness when getting up from seated position  One year ago, he was noticed to have decreased stride while walking, decreased arm swing, he has fell couple times over the past couple years, but no clear reasons, there was no significant hands tremor  His maternal grandmother suffered Parkinson's disease, he works as a Personal assistant a lot of driving,desk job, sedentary lifestyle  MRI brain (without contrast) demonstrating subtle left frontal subcortical focus (50mm) of gliosis.  He tolerated requip xr 2mg  3 tabs qhs well, no significant side effect, but benefit only lasted few days, no significant improvement noticed now, he has mild gait difficulty, fatigue easily   UPDATE April 19th 2016: he is taking  Requip XL are 6 mg 3 tablets every night, Azilect 1 mg every morning, tolerating medication well, but complains of more than USD2000 co-pay for 3 months shipment, he also complains of gradually worsening right hand clumsiness, difficulty buttoning,  Mild early morning lightheadedness, loss sense of smell, chronic constipation, he recently injured his left Achilles tendon, had physical therapy, change of walking posture,  UPDATE August 15th 2016: He has left Achilles tendon injury, is in a lot of pain, limited exercise,  He continued to work full-time, driving average 20 hours each week, complains of excessive daytime sleepiness sometimes, he is now taking requip xr 6 mg 3 tablets all at once, tends to get up very early in the morning,  He denies significant memory trouble  Update November 25 2015: 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 Azilect 1 mg daily He now complain of right hip pain since early Feb 2017 after climbing up steps, his wife is concerned about his driving, with slow reaction time    REVIEW OF SYSTEMS: Full 14 system review of systems performed and notable only for agitation, joint pain  ALLERGIES: Allergies  Allergen Reactions  . Accupril [Quinapril Hcl]     HOME MEDICATIONS: Outpatient Prescriptions Prior to Visit  Medication Sig Dispense Refill  . aspirin 81 MG tablet Take 81 mg by mouth daily.    Marland Kitchen atorvastatin (LIPITOR) 40 MG tablet Take 40 mg by mouth Daily.    . carvedilol (COREG) 3.125  MG tablet Take 3.125 mg by mouth 2 (two) times daily with a meal.    . glimepiride (AMARYL) 2 MG tablet Take 2 mg by mouth Daily.    Marland Kitchen losartan-hydrochlorothiazide (HYZAAR) 100-25 MG per tablet Take by mouth Daily.    . metFORMIN (GLUCOPHAGE-XR) 500 MG 24 hr tablet Take 500 mg by mouth 2 (two) times daily.     . Multiple Vitamin (MULTIVITAMIN) capsule Take 1 capsule by  mouth daily.    . Multiple Vitamins-Minerals (ICAPS MV PO) Take 1 capsule by mouth daily.    . nitroGLYCERIN (NITRODUR - DOSED IN MG/24 HR) 0.2 mg/hr patch As directed for left heel  3  . rasagiline (AZILECT) 1 MG TABS tablet Take 1 tablet (1 mg total) by mouth daily. 90 tablet 3  . Ropinirole HCl 6 MG TB24 Take 3 tablets (18 mg total) by mouth at bedtime. 270 tablet 3  . tolterodine (DETROL LA) 4 MG 24 hr capsule Take 4 mg by mouth daily.     No facility-administered medications prior to visit.    PAST MEDICAL HISTORY: Past Medical History  Diagnosis Date  . Hypertension   . Diabetes ([place])   . High cholesterol   . Sleep apnea   . Lymphoplasmacytoid lymphoma, CLL ([place])     Pre  . Tendonitis     left achiles tendon    PAST SURGICAL HISTORY: Past Surgical History  Procedure Laterality Date  . Knee surgery Left 2014  . Tonsillectomy  1952    FAMILY HISTORY: Family History  Problem Relation Age of Onset  . Stroke Father   . Heart attack Father   . Sleep apnea Mother   . Cancer Sister     Breast cancer  . Diabetes Sister   . Sleep apnea Brother     SOCIAL HISTORY: Social History   Social History  . Marital Status: Married    Spouse Name: Hilda Blades   . Number of Children: 1  . Years of Education: BS   Occupational History  .      Works at [employer] Topics  . Smoking status: Never Smoker   . Smokeless tobacco: Former Systems developer    Types: Snuff    Quit date: 03/22/1989  . Alcohol Use: Yes     Comment: Consumes alcohol on occasion  . Drug Use: No  . Sexual Activity: Not on file   Other Topics Concern  . Not on file   Social History Narrative   Pt is here today for his 4 mo check up.Pt is married and does still work at this time.   Married, 1 children   Left handed   BS degree   Drinks caffeine free            PHYSICAL EXAM  Filed Vitals:   11/25/15 1557  BP: 157/79  Pulse: 69  Height: 5\' 11"  (1.803 m)  Weight: 265 lb (120.203  kg)   Body mass index is 36.98 kg/(m^2).  PHYSICAL EXAMNIATION:  Gen: NAD, conversant, well nourised, obese, well groomed                     Cardiovascular: Regular rate rhythm, no peripheral edema, warm, nontender. Eyes: Conjunctivae clear without exudates or hemorrhage Neck: Supple, no carotid bruise. Pulmonary: Clear to auscultation bilaterally   NEUROLOGICAL EXAM:  MENTAL STATUS: Speech:    Speech is normal; fluent and spontaneous with normal comprehension.  Cognition: Mini-Mental Status Examination is 30 out  of 30    The patient is oriented to person, place, and time;     recent and remote memory intact;     language fluent;     normal attention, concentration,     fund of knowledge.  CRANIAL NERVES: CN II: Visual fields are full to confrontation. Pupils are equal round reactive to light. CN III, IV, VI: extraocular movement are normal. No ptosis. CN V: Facial sensation is intact to pinprick in all 3 divisions bilaterally. Corneal responses are intact.  CN VII: Face is symmetric with normal eye closure and smile. CN VIII: Hearing is normal to rubbing fingers CN IX, X: Palate elevates symmetrically. Phonation is normal. CN XI: Head turning and shoulder shrug are intact CN XII: Tongue is midline with normal movements and no atrophy.  MOTOR: Mild right more than left limb and nuchal rigidity, no weakness, normal bulk, early fatigability with rapid wrist opening and closure   REFLEXES: Reflexes are 2+ and symmetric at the biceps, triceps, knees, and ankles. Plantar responses are flexor.  SENSORY: Light touch, pinprick, position sense, and vibration sense are intact in fingers and toes.  COORDINATION: Rapid alternating movements and fine finger movements are intact. There is no dysmetria on finger-to-nose and heel-knee-shin.   GAIT/STANCE: Need to push up from seated position, mild antalgic due to right hip pain   DIAGNOSTIC DATA (LABS, IMAGING, TESTING) - I  reviewed patient records, labs, notes, testing and imaging myself where available.  Lab Results  Component Value Date   WBC 41.8* 09/19/2015   HGB 13.8 09/19/2015   HCT 42.0 09/19/2015   MCV 87.1 09/19/2015   PLT 314 09/19/2015      Component Value Date/Time   NA 140 09/19/2015 0758   NA 138 12/19/2007 2253   K 4.0 09/19/2015 0758   K 3.6 12/19/2007 2253   CL 104 03/21/2013 0818   CL 104 12/19/2007 2253   CO2 22 09/19/2015 0758   GLUCOSE 147* 09/19/2015 0758   GLUCOSE 205* 03/21/2013 0818   GLUCOSE 172* 12/19/2007 2253   BUN 19.7 09/19/2015 0758   BUN 15 12/19/2007 2253   CREATININE 1.1 09/19/2015 0758   CREATININE 1.2 12/19/2007 2253   CALCIUM 9.8 09/19/2015 0758   PROT 6.7 09/19/2015 0758   ALBUMIN 4.1 09/19/2015 0758   AST 18 09/19/2015 0758   ALT 17 09/19/2015 0758   ALKPHOS 57 09/19/2015 0758   BILITOT 0.49 09/19/2015 0758   Lab Results  Component Value Date   CHOL  12/20/2007    121        ATP III CLASSIFICATION:  <200     mg/dL   Desirable  200-239  mg/dL   Borderline High  >=240    mg/dL   High   HDL 39* 12/20/2007   LDLCALC  12/20/2007    69        Total Cholesterol/HDL:CHD Risk Coronary Heart Disease Risk Table                     Men   Women  1/2 Average Risk   3.4   3.3   TRIG 65 12/20/2007   CHOLHDL 3.1 12/20/2007   ASSESSMENT AND PLAN 65 y.o. year old    Idiopathic Parkinson's disease  Keep requip xr 6mg  3 tabs po qhs Azilect 1mg  qday  Gait abnormality  Which is also complicated by his right hip pain, left Achilles tendinitis,  When necessary says,  Encouraged moderate exercise Return to clinic with  nurse practitioner in 6 months    Marcial Pacas, M.D. Ph.D.  [facility] [place], [place] [id number] Phone: [phone] Fax:      [phone]

## 2015-12-27 ENCOUNTER — Encounter: Payer: Self-pay | Admitting: *Deleted

## 2016-02-28 ENCOUNTER — Telehealth: Payer: Self-pay | Admitting: Oncology

## 2016-02-28 NOTE — Telephone Encounter (Signed)
returned call and s.w. pt and r/s appt to 1st available afternoon appt....pt ok and  aware

## 2016-03-25 ENCOUNTER — Ambulatory Visit: Payer: BLUE CROSS/BLUE SHIELD | Admitting: Oncology

## 2016-03-25 ENCOUNTER — Other Ambulatory Visit: Payer: BLUE CROSS/BLUE SHIELD

## 2016-04-08 ENCOUNTER — Telehealth: Payer: Self-pay | Admitting: Oncology

## 2016-04-08 NOTE — Telephone Encounter (Signed)
S/w pt, advised appt chg from 6/30 to 7/18 @ 10.15am. Pt says he just left the hosp with a WBC of 62 and was advised to see the doc sooner than 7/18. S/w Erline Levine (desk rn), she has made Dr. Alen Blew aware of pt's WBC and he thinks it is ok to keep pt scheduled on 7/18. Advised pt. Pt verbalized understanding but will call us if needed.

## 2016-04-10 ENCOUNTER — Ambulatory Visit: Payer: BLUE CROSS/BLUE SHIELD | Admitting: Oncology

## 2016-04-10 ENCOUNTER — Other Ambulatory Visit: Payer: BLUE CROSS/BLUE SHIELD

## 2016-04-28 ENCOUNTER — Telehealth: Payer: Self-pay | Admitting: Oncology

## 2016-04-28 ENCOUNTER — Other Ambulatory Visit (HOSPITAL_BASED_OUTPATIENT_CLINIC_OR_DEPARTMENT_OTHER): Payer: BLUE CROSS/BLUE SHIELD

## 2016-04-28 ENCOUNTER — Ambulatory Visit (HOSPITAL_BASED_OUTPATIENT_CLINIC_OR_DEPARTMENT_OTHER): Payer: BLUE CROSS/BLUE SHIELD | Admitting: Oncology

## 2016-04-28 VITALS — BP 153/67 | HR 60 | Temp 98.0°F | Resp 18 | Ht 71.0 in | Wt 256.5 lb

## 2016-04-28 DIAGNOSIS — D72829 Elevated white blood cell count, unspecified: Secondary | ICD-10-CM

## 2016-04-28 DIAGNOSIS — D72820 Lymphocytosis (symptomatic): Secondary | ICD-10-CM | POA: Diagnosis not present

## 2016-04-28 DIAGNOSIS — C9110 Chronic lymphocytic leukemia of B-cell type not having achieved remission: Secondary | ICD-10-CM

## 2016-04-28 DIAGNOSIS — G20 Parkinson's disease: Secondary | ICD-10-CM | POA: Diagnosis not present

## 2016-04-28 LAB — CBC WITH DIFFERENTIAL/PLATELET
BASO%: 0.4 % (ref 0.0–2.0)
BASOS ABS: 0.2 10*3/uL — AB (ref 0.0–0.1)
EOS ABS: 0.2 10*3/uL (ref 0.0–0.5)
EOS%: 0.4 % (ref 0.0–7.0)
HEMATOCRIT: 41.9 % (ref 38.4–49.9)
HGB: 13.4 g/dL (ref 13.0–17.1)
LYMPH#: 49.5 10*3/uL — AB (ref 0.9–3.3)
LYMPH%: 89.7 % — ABNORMAL HIGH (ref 14.0–49.0)
MCH: 28.8 pg (ref 27.2–33.4)
MCHC: 32 g/dL (ref 32.0–36.0)
MCV: 89.9 fL (ref 79.3–98.0)
MONO#: 0.4 10*3/uL (ref 0.1–0.9)
MONO%: 0.8 % (ref 0.0–14.0)
NEUT#: 4.8 10*3/uL (ref 1.5–6.5)
NEUT%: 8.7 % — AB (ref 39.0–75.0)
PLATELETS: 284 10*3/uL (ref 140–400)
RBC: 4.66 10*6/uL (ref 4.20–5.82)
RDW: 14 % (ref 11.0–14.6)
WBC: 55.1 10*3/uL (ref 4.0–10.3)

## 2016-04-28 LAB — COMPREHENSIVE METABOLIC PANEL
ALT: 18 U/L (ref 0–55)
ANION GAP: 9 meq/L (ref 3–11)
AST: 18 U/L (ref 5–34)
Albumin: 4.1 g/dL (ref 3.5–5.0)
Alkaline Phosphatase: 65 U/L (ref 40–150)
BUN: 15.7 mg/dL (ref 7.0–26.0)
CALCIUM: 9.8 mg/dL (ref 8.4–10.4)
CHLORIDE: 104 meq/L (ref 98–109)
CO2: 28 mEq/L (ref 22–29)
CREATININE: 1.1 mg/dL (ref 0.7–1.3)
EGFR: 74 mL/min/{1.73_m2} — ABNORMAL LOW (ref >90)
Glucose: 129 mg/dl (ref 70–140)
Potassium: 4.1 mEq/L (ref 3.5–5.1)
Sodium: 141 mEq/L (ref 136–145)
Total Bilirubin: 0.41 mg/dL (ref 0.20–1.20)
Total Protein: 6.8 g/dL (ref 6.4–8.3)

## 2016-04-28 LAB — LACTATE DEHYDROGENASE: LDH: 288 U/L — AB (ref 125–245)

## 2016-04-28 LAB — TECHNOLOGIST REVIEW

## 2016-04-28 NOTE — Progress Notes (Signed)
Hematology and Oncology Follow Up Visit  Cory [name] AO:[id number] [birth year] 65 y.o. 04/28/2016 10:35 AM   Principle Diagnosis: 65 year old with early stage CLL diagnosed in 2013. His diagnosis confirmed by flow cytometry. He presented there with leukocytosis without lymphadenopathy indicating stage 0 disease. Currently he has lymphocytosis and lymphadenopathy that is asymptomatic.  Current therapy: Observation and surveillance.   Interim History: Cory [name] presents for follow up visit with his wife. Since his last visit, he developed lower extremity edema and was seen in the emergency department in June 2017. His edema have resolved with using of compression stockings. He does not report any new complaints since last visit. He denied any fevers or chills or sweats. He denied any excessive weight loss or fatigue. He does not report any palpable lymphadenopathy. His appetite and performance status remains excellent. He continues to work full time and seems to be managing despite his Parkinson's disease.  He did not report any headaches or blurry vision or double vision. Did not report any syncope. He did not report any chest pain shortness of breath or difficulty breathing. He does not report any abdominal pain or diarrhea. He does report occasional constipation that is easily managed. Is not reporting any hematuria or dysuria. Did not report any arthralgias and myalgias.  He has not reported any hospitalization or illnesses. Rest of his review of systems unremarkable.   Medications: I have reviewed the patient's current medications. Unchanged by my review. Current Outpatient Prescriptions  Medication Sig Dispense Refill  . aspirin 81 MG tablet Take 81 mg by mouth daily.    Marland Kitchen atorvastatin (LIPITOR) 40 MG tablet Take 40 mg by mouth Daily.    . carvedilol (COREG) 3.125 MG tablet Take 3.125 mg by mouth 2 (two) times daily with a meal.    . glimepiride (AMARYL) 2 MG tablet Take 2 mg by mouth Daily.     Marland Kitchen losartan-hydrochlorothiazide (HYZAAR) 100-25 MG per tablet Take by mouth Daily.    . metFORMIN (GLUCOPHAGE-XR) 500 MG 24 hr tablet Take 500 mg by mouth 2 (two) times daily.     . Naproxen Sodium (ALEVE PO) Take 220 mg by mouth as directed.    . Multiple Vitamins-Minerals (ICAPS MV PO) Take 1 capsule by mouth daily.    . nitroGLYCERIN (NITRODUR - DOSED IN MG/24 HR) 0.2 mg/hr patch As directed for left heel  3  . rasagiline (AZILECT) 1 MG TABS tablet Take 1 tablet (1 mg total) by mouth daily. 90 tablet 3  . Ropinirole HCl 6 MG TB24 Take 3 tablets (18 mg total) by mouth at bedtime. 270 tablet 3  . sildenafil (VIAGRA) 100 MG tablet Take by mouth.    . tolterodine (DETROL LA) 4 MG 24 hr capsule Take 4 mg by mouth daily.     No current facility-administered medications for this visit.    Allergies:  Allergies  Allergen Reactions  . Accupril [Quinapril Hcl]   . Solaraze [Diclofenac Sodium]     Past Medical History, Surgical history, Social history, and Family History were reviewed and updated.   Physical Exam: Blood pressure 153/67, pulse 60, temperature 98 F (36.7 C), temperature source Oral, resp. rate 18, height 5\' 11"  (1.803 m), weight 256 lb 8 oz (116.348 kg), SpO2 96 %. ECOG: 0 General appearance: Pleasant-appearing gentleman without distress. Head: Normocephalic, without obvious abnormality, atraumatic no oral thrush noted. Neck: no adenopathy no thyromegaly. Lymph nodes: Cervical, supraclavicular, and axillary nodes normal. No enlarged lymphadenopathy on examination. Heart:regular rate and  rhythm, S1, S2 normal, no murmur, click, rub or gallop Lung:chest clear, no wheezing, rales. No dullness to percussion. Abdomin: soft, non-tender, without masses or organomegaly no tingling dullness or ascites. No splenomegaly. EXT:no erythema, induration, or nodules Skin: No rashes or lesions.  Lab Results: Lab Results  Component Value Date   WBC 55.1* 04/28/2016   HGB 13.4  04/28/2016   HCT 41.9 04/28/2016   MCV 89.9 04/28/2016   PLT 284 04/28/2016     Chemistry      Component Value Date/Time   NA 141 04/28/2016 0951   NA 138 12/19/2007 2253   K 4.1 04/28/2016 0951   K 3.6 12/19/2007 2253   CL 104 03/21/2013 0818   CL 104 12/19/2007 2253   CO2 28 04/28/2016 0951   BUN 15.7 04/28/2016 0951   BUN 15 12/19/2007 2253   CREATININE 1.1 04/28/2016 0951   CREATININE 1.2 12/19/2007 2253      Component Value Date/Time   CALCIUM 9.8 04/28/2016 0951   ALKPHOS 65 04/28/2016 0951   AST 18 04/28/2016 0951   ALT 18 04/28/2016 0951   BILITOT 0.41 04/28/2016 0951        Impression and Plan:  This is a 65 year old gentleman with:  1. Leukocytosis and mild lymphocytosis. Flow cytometry did show B cell markers consistent with CLL.  His white cell count continues to increase at a very slow pace. His doubling time is close to 24 months. He does have a symptomatic lymphadenopathy.  The natural course of this disease was discussed again and indications for treatment were reviewed. Indication to start treatment for CLL includes painful, bulky symptomatic adenopathy, constitutional symptoms or organ compromise. At this time, he has not of the symptoms and no indications for treatment. However, we will continue to monitor him closely and I discussed with him the role of systemic chemotherapy once these symptoms occur. I continue to educate him about the indications for treatment and symptoms to report. At this time we will continue to monitor him clinically and institute therapy if he develops symptoms.    2. Parkinson's disease: Seems to be manageable at this time.  3. Follow-up: Will be in 6 months.    [facility], MD 7/18/201710:35 AM

## 2016-04-28 NOTE — Telephone Encounter (Signed)
Gave pt cal & avs °

## 2016-05-25 ENCOUNTER — Ambulatory Visit (INDEPENDENT_AMBULATORY_CARE_PROVIDER_SITE_OTHER): Payer: BLUE CROSS/BLUE SHIELD | Admitting: Adult Health

## 2016-05-25 ENCOUNTER — Encounter: Payer: Self-pay | Admitting: Adult Health

## 2016-05-25 VITALS — BP 150/80 | HR 72 | Ht 70.75 in | Wt 258.0 lb

## 2016-05-25 DIAGNOSIS — G20 Parkinson's disease: Secondary | ICD-10-CM

## 2016-05-25 DIAGNOSIS — R269 Unspecified abnormalities of gait and mobility: Secondary | ICD-10-CM | POA: Diagnosis not present

## 2016-05-25 NOTE — Patient Instructions (Signed)
Continue Requip and Azilect If your symptoms worsen or you develop new symptoms please let us know.

## 2016-05-25 NOTE — Progress Notes (Signed)
PATIENT: Cory [name] DOB: [birth year]  REASON FOR VISIT: follow up HISTORY FROM: patient  HISTORY OF PRESENT ILLNESS: HISTORY (YY): 65 years old right-handed Caucasian male, accompanied by his wife, referred by his primary care physician patterns loss for evaluation of possible Parkinson's disease.  He had past medical history of obesity, diabetes, hypertension, hyperlipidemia, obstructive sleep apnea, using CPAP machine, sedentary lifestyle   Since 2013, he developed intermittent mild erectile dysfunction, also noticed loss sense of smell, was considered due to CPAP machine use, wife also noticed that he has excessive movement during his sleep, vivid dreams, acting and talking during dreams, he has occasionally lightheadedness when getting up from seated position  One year ago, he was noticed to have decreased stride while walking, decreased arm swing, he has fell couple times over the past couple years, but no clear reasons, there was no significant hands tremor  His maternal grandmother suffered Parkinson's disease, he works as a Personal assistant a lot of driving,desk job, sedentary lifestyle  MRI brain (without contrast) demonstrating subtle left frontal subcortical focus (37mm) of gliosis.  He tolerated requip xr 2mg  3 tabs qhs well, no significant side effect, but benefit only lasted few days, no significant improvement noticed now, he has mild gait difficulty, fatigue easily   UPDATE April 19th 2016: he is taking Requip XL are 6 mg 3 tablets every night, Azilect 1 mg every morning, tolerating medication well, but complains of more than USD2000 co-pay for 3 months shipment, he also complains of gradually worsening right hand clumsiness, difficulty buttoning,  Mild early morning lightheadedness, loss sense of smell, chronic constipation, he recently injured his left Achilles tendon, had physical therapy, change of walking posture,  UPDATE August 15th 2016: He has left  Achilles tendon injury, is in a lot of pain, limited exercise,  He continued to work full-time, driving average 20 hours each week, complains of excessive daytime sleepiness sometimes, he is now taking requip xr 6 mg 3 tablets all at once, tends to get up very early in the morning,  He denies significant memory trouble  Update November 25 2015: 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 Azilect 1 mg daily He now complain of right hip pain since early Feb 2017 after climbing up steps, his wife is concerned about his driving, with slow reaction time   Today 05/25/16:  Cory [name] is a 65 year old male with a history of Parkinson's disease. He returns today for follow-up. He is currently taking Requip extended release 60 mg 3 tablets at bedtime. He is also on Azilect 1 mg daily. He reports that overall he is doing well. He does feel that his gait may be gotten slightly worse. He does notice that he has a slight shuffling gait especially in "doorways." He does not use a cane or walker when ambulating. Denies any falls. Denies any freezing episodes. Patient reports that he sleeps well at night just not long enough. Denies any hallucinations. Occasionally he will have a tremor in the left hand. Denies any trouble swallowing. Denies any new neurological symptoms. Continues to have trouble with left Achilles tendon. He returns today for an evaluation.  REVIEW OF SYSTEMS: Out of a complete 14 system review of symptoms, the patient complains only of the following symptoms, and all other reviewed systems are negative.  Excessive sweating, constipation, leg swelling, restless leg, apnea, acting out dreams,  walking difficulty, joint pain  ALLERGIES: Allergies  Allergen Reactions  . Accupril [Quinapril Hcl]   . Solaraze [Diclofenac Sodium]     HOME MEDICATIONS: Outpatient Medications Prior to  Visit  Medication Sig Dispense Refill  . aspirin 81 MG tablet Take 81 mg by mouth daily.    Marland Kitchen atorvastatin (LIPITOR) 40 MG tablet Take 40 mg by mouth Daily.    . carvedilol (COREG) 3.125 MG tablet Take 3.125 mg by mouth 2 (two) times daily with a meal.    . glimepiride (AMARYL) 2 MG tablet Take 2 mg by mouth Daily.    Marland Kitchen losartan-hydrochlorothiazide (HYZAAR) 100-25 MG per tablet Take by mouth Daily.    . metFORMIN (GLUCOPHAGE-XR) 500 MG 24 hr tablet Take 500 mg by mouth 2 (two) times daily.     . Multiple Vitamins-Minerals (ICAPS MV PO) Take 1 capsule by mouth daily.    . Naproxen Sodium (ALEVE PO) Take 220 mg by mouth as directed.    . rasagiline (AZILECT) 1 MG TABS tablet Take 1 tablet (1 mg total) by mouth daily. 90 tablet 3  . Ropinirole HCl 6 MG TB24 Take 3 tablets (18 mg total) by mouth at bedtime. 270 tablet 3  . sildenafil (VIAGRA) 100 MG tablet Take by mouth.    . tolterodine (DETROL LA) 4 MG 24 hr capsule Take 4 mg by mouth daily.    . nitroGLYCERIN (NITRODUR - DOSED IN MG/24 HR) 0.2 mg/hr patch As directed for left heel  3   No facility-administered medications prior to visit.     PAST MEDICAL HISTORY: Past Medical History:  Diagnosis Date  . Diabetes ([place])   . High cholesterol   . Hypertension   . 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 Sleep apnea   . Tendonitis    left achiles tendon    PAST SURGICAL HISTORY: Past Surgical History:  Procedure Laterality Date  . KNEE SURGERY Left 2014  . TONSILLECTOMY  1952    FAMILY HISTORY: Family History  Problem Relation Age of Onset  . Stroke Father   . Heart attack Father   . Sleep apnea Mother   . Cancer Sister     Breast cancer  . Diabetes Sister   . Sleep apnea Brother     SOCIAL HISTORY: Social History   Social History  . Marital status: Married    Spouse name: Hilda Blades   . Number of children: 1  . Years of education: BS   Occupational History  .      Works at [employer]  Topics  . Smoking status: Never Smoker  . Smokeless tobacco: Former Systems developer    Types: Snuff    Quit date: 03/22/1989  . Alcohol use Yes     Comment: Consumes alcohol on occasion  . Drug use: No  . Sexual activity: Not on file   Other Topics Concern  . Not on file   Social History Narrative   Pt is here today for his 4 mo check up.Pt is married and does still work at this time.   Married, 1 children   Left handed   BS degree   Drinks caffeine free            PHYSICAL EXAM  Vitals:   05/25/16 1500  BP: (!) 150/80  Pulse: 72  Weight: 258 lb (117 kg)  Height: 5' 10.75" (1.797 m)   Body mass index is 36.24 kg/m.  Generalized: Well developed,  in no acute distress   Neurological examination  Mentation: Alert oriented to time, place, history taking. Follows all commands speech and language fluent Cranial nerve II-XII: Pupils were equal round reactive to light. Extraocular movements were full, visual field were full on confrontational test. Facial sensation and strength were normal. Uvula tongue midline. Head turning and shoulder shrug  were normal and symmetric. Motor: The motor testing reveals 5 over 5 strength of all 4 extremities. Good symmetric motor tone is noted throughout.  Sensory: Sensory testing is intact to soft touch on all 4 extremities. No evidence of extinction is noted. Finger taps moderately impaired on the left. Coordination: Cerebellar testing reveals good finger-nose-finger and heel-to-shin bilaterally.  Gait and station: Patient pushes off the chair when standing. The patient has slightly decreased stride when he initially starts to ambulate but it does improve. Decreased arm swing bilaterally. He takes 4 steps to turn. Tandem gait is normal. Romberg is negative. Reflexes: Deep tendon reflexes are symmetric and normal bilaterally.   DIAGNOSTIC DATA (LABS, IMAGING, TESTING) - I reviewed patient records, labs, notes, testing and imaging myself where  available.  Lab Results  Component Value Date   WBC 55.1 (HH) 04/28/2016   HGB 13.4 04/28/2016   HCT 41.9 04/28/2016   MCV 89.9 04/28/2016   PLT 284 04/28/2016      Component Value Date/Time   NA 141 04/28/2016 0951   K 4.1 04/28/2016 0951   CL 104 03/21/2013 0818   CO2 28 04/28/2016 0951   GLUCOSE 129 04/28/2016 0951   GLUCOSE 205 (H) 03/21/2013 0818   BUN 15.7 04/28/2016 0951   CREATININE 1.1 04/28/2016 0951   CALCIUM 9.8 04/28/2016 0951   PROT 6.8 04/28/2016 0951   ALBUMIN 4.1 04/28/2016 0951   AST 18 04/28/2016 0951   ALT 18 04/28/2016 0951   ALKPHOS 65 04/28/2016 0951   BILITOT 0.41 04/28/2016 0951        ASSESSMENT AND PLAN 65 y.o. year old male  has a past medical history of Diabetes ([place]); High cholesterol; Hypertension; Lymphoplasmacytoid lymphoma, CLL ([employer]); Sleep apnea; and Tendonitis. here with:  1. Parkinson's disease 2. abnormality of gait  The patient will remain on Requip XL 6 mg at bedtime. He will continue on Azilect. Advised that if his gait worsens he should let us know. He will follow-up in 6 months with Dr. Krista Blue.     Ward Givens, MSN, NP-C 05/25/2016, 3:07 PM [facility] [address] [place], [place] [address] [phone]

## 2016-05-26 NOTE — Progress Notes (Signed)
I have reviewed and agreed above plan. 

## 2016-10-14 ENCOUNTER — Telehealth: Payer: Self-pay | Admitting: Oncology

## 2016-10-14 NOTE — Telephone Encounter (Signed)
Patient called to cancel his appointment per he has moved away from [place] to [place]. He does not wish to reschedule.

## 2016-10-29 ENCOUNTER — Other Ambulatory Visit: Payer: BLUE CROSS/BLUE SHIELD

## 2016-10-29 ENCOUNTER — Ambulatory Visit: Payer: BLUE CROSS/BLUE SHIELD | Admitting: Oncology

## 2016-11-06 ENCOUNTER — Telehealth: Payer: Self-pay | Admitting: Oncology

## 2016-11-06 NOTE — Telephone Encounter (Signed)
FAXED RECORDS TO [facility] RELEASE ID [id number]

## 2016-11-23 ENCOUNTER — Ambulatory Visit: Payer: BLUE CROSS/BLUE SHIELD | Admitting: Neurology

## 2024-07-12 DEATH — deceased
